# Patient Record
Sex: Female | Born: 1980 | Race: White | Hispanic: No | Marital: Married | Smoking: Current some day smoker
Health system: Southern US, Community
[De-identification: ages and names within clinical notes are randomized; demographics above are authoritative.]

## PROBLEM LIST (undated history)

## (undated) DIAGNOSIS — N879 Dysplasia of cervix uteri, unspecified: Secondary | ICD-10-CM

## (undated) DIAGNOSIS — F419 Anxiety disorder, unspecified: Secondary | ICD-10-CM

## (undated) DIAGNOSIS — R112 Nausea with vomiting, unspecified: Secondary | ICD-10-CM

## (undated) DIAGNOSIS — N3010 Interstitial cystitis (chronic) without hematuria: Secondary | ICD-10-CM

## (undated) DIAGNOSIS — Z9889 Other specified postprocedural states: Secondary | ICD-10-CM

## (undated) HISTORY — PX: KNEE ARTHROSCOPY WITH LATERAL RELEASE: SHX5649

## (undated) HISTORY — DX: Dysplasia of cervix uteri, unspecified: N87.9

## (undated) HISTORY — PX: SYNOVECTOMY: SHX5180

## (undated) HISTORY — DX: Interstitial cystitis (chronic) without hematuria: N30.10

## (undated) HISTORY — PX: CYSTOSCOPY WITH HYDRODISTENSION AND BIOPSY: SHX5127

## (undated) HISTORY — PX: PLACEMENT OF BREAST IMPLANTS: SHX6334

## (undated) HISTORY — PX: CERVIX LESION DESTRUCTION: SHX591

---
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
## 1999-10-23 ENCOUNTER — Encounter: Payer: Self-pay | Admitting: *Deleted

## 1999-10-23 ENCOUNTER — Inpatient Hospital Stay (HOSPITAL_COMMUNITY): Admission: AD | Admit: 1999-10-23 | Discharge: 1999-10-23 | Payer: Self-pay | Admitting: Obstetrics and Gynecology

## 1999-10-24 ENCOUNTER — Encounter: Payer: Self-pay | Admitting: Obstetrics

## 1999-10-30 ENCOUNTER — Encounter: Admission: RE | Admit: 1999-10-30 | Discharge: 1999-10-30 | Payer: Self-pay | Admitting: Family Medicine

## 1999-11-13 ENCOUNTER — Encounter: Admission: RE | Admit: 1999-11-13 | Discharge: 1999-11-13 | Payer: Self-pay | Admitting: Family Medicine

## 1999-11-19 ENCOUNTER — Encounter: Admission: RE | Admit: 1999-11-19 | Discharge: 1999-11-19 | Payer: Self-pay | Admitting: Family Medicine

## 1999-12-26 ENCOUNTER — Encounter: Admission: RE | Admit: 1999-12-26 | Discharge: 1999-12-26 | Payer: Self-pay | Admitting: Family Medicine

## 1999-12-27 ENCOUNTER — Encounter: Admission: RE | Admit: 1999-12-27 | Discharge: 1999-12-27 | Payer: Self-pay | Admitting: Family Medicine

## 1999-12-31 ENCOUNTER — Encounter: Admission: RE | Admit: 1999-12-31 | Discharge: 1999-12-31 | Payer: Self-pay | Admitting: Family Medicine

## 2000-01-03 ENCOUNTER — Ambulatory Visit (HOSPITAL_COMMUNITY): Admission: RE | Admit: 2000-01-03 | Discharge: 2000-01-03 | Payer: Self-pay | Admitting: Obstetrics

## 2000-01-11 ENCOUNTER — Encounter: Admission: RE | Admit: 2000-01-11 | Discharge: 2000-01-11 | Payer: Self-pay | Admitting: Family Medicine

## 2000-01-16 ENCOUNTER — Encounter: Admission: RE | Admit: 2000-01-16 | Discharge: 2000-01-16 | Payer: Self-pay | Admitting: Family Medicine

## 2000-01-28 ENCOUNTER — Encounter: Admission: RE | Admit: 2000-01-28 | Discharge: 2000-01-28 | Payer: Self-pay | Admitting: Family Medicine

## 2000-02-04 ENCOUNTER — Encounter: Admission: RE | Admit: 2000-02-04 | Discharge: 2000-02-04 | Payer: Self-pay | Admitting: Family Medicine

## 2000-02-11 ENCOUNTER — Inpatient Hospital Stay (HOSPITAL_COMMUNITY): Admission: AD | Admit: 2000-02-11 | Discharge: 2000-02-11 | Payer: Self-pay | Admitting: Obstetrics

## 2000-02-14 ENCOUNTER — Encounter: Admission: RE | Admit: 2000-02-14 | Discharge: 2000-02-14 | Payer: Self-pay | Admitting: Family Medicine

## 2000-02-14 ENCOUNTER — Encounter (HOSPITAL_COMMUNITY): Admission: RE | Admit: 2000-02-14 | Discharge: 2000-02-18 | Payer: Self-pay | Admitting: *Deleted

## 2000-02-14 ENCOUNTER — Ambulatory Visit (HOSPITAL_COMMUNITY): Admission: RE | Admit: 2000-02-14 | Discharge: 2000-02-14 | Payer: Self-pay | Admitting: Sports Medicine

## 2000-02-17 ENCOUNTER — Inpatient Hospital Stay (HOSPITAL_COMMUNITY): Admission: AD | Admit: 2000-02-17 | Discharge: 2000-02-20 | Payer: Self-pay | Admitting: Obstetrics

## 2000-04-14 ENCOUNTER — Encounter: Admission: RE | Admit: 2000-04-14 | Discharge: 2000-04-14 | Payer: Self-pay | Admitting: Family Medicine

## 2000-05-22 ENCOUNTER — Encounter: Admission: RE | Admit: 2000-05-22 | Discharge: 2000-05-22 | Payer: Self-pay | Admitting: Family Medicine

## 2001-06-09 ENCOUNTER — Emergency Department (HOSPITAL_COMMUNITY): Admission: EM | Admit: 2001-06-09 | Discharge: 2001-06-09 | Payer: Self-pay

## 2001-12-31 ENCOUNTER — Inpatient Hospital Stay (HOSPITAL_COMMUNITY): Admission: AD | Admit: 2001-12-31 | Discharge: 2001-12-31 | Payer: Self-pay | Admitting: *Deleted

## 2002-01-03 ENCOUNTER — Encounter: Payer: Self-pay | Admitting: Obstetrics and Gynecology

## 2002-01-03 ENCOUNTER — Inpatient Hospital Stay (HOSPITAL_COMMUNITY): Admission: AD | Admit: 2002-01-03 | Discharge: 2002-01-03 | Payer: Self-pay | Admitting: Obstetrics and Gynecology

## 2002-09-23 ENCOUNTER — Inpatient Hospital Stay (HOSPITAL_COMMUNITY): Admission: AD | Admit: 2002-09-23 | Discharge: 2002-09-23 | Payer: Self-pay | Admitting: *Deleted

## 2005-11-11 ENCOUNTER — Inpatient Hospital Stay (HOSPITAL_COMMUNITY): Admission: AD | Admit: 2005-11-11 | Discharge: 2005-11-12 | Payer: Self-pay | Admitting: Obstetrics & Gynecology

## 2007-05-05 ENCOUNTER — Ambulatory Visit (HOSPITAL_BASED_OUTPATIENT_CLINIC_OR_DEPARTMENT_OTHER): Admission: RE | Admit: 2007-05-05 | Discharge: 2007-05-05 | Payer: Self-pay | Admitting: Urology

## 2007-05-05 HISTORY — PX: CYSTOSCOPY WITH HYDRODISTENSION AND BIOPSY: SHX5127

## 2007-05-07 ENCOUNTER — Emergency Department (HOSPITAL_COMMUNITY): Admission: EM | Admit: 2007-05-07 | Discharge: 2007-05-07 | Payer: Self-pay | Admitting: Emergency Medicine

## 2007-06-30 ENCOUNTER — Encounter: Admission: RE | Admit: 2007-06-30 | Discharge: 2007-07-22 | Payer: Self-pay | Admitting: Urology

## 2007-10-18 ENCOUNTER — Emergency Department (HOSPITAL_COMMUNITY): Admission: EM | Admit: 2007-10-18 | Discharge: 2007-10-18 | Payer: Self-pay | Admitting: Emergency Medicine

## 2007-11-30 ENCOUNTER — Emergency Department (HOSPITAL_COMMUNITY): Admission: EM | Admit: 2007-11-30 | Discharge: 2007-11-30 | Payer: Self-pay | Admitting: Emergency Medicine

## 2009-03-24 ENCOUNTER — Ambulatory Visit (HOSPITAL_COMMUNITY): Admission: RE | Admit: 2009-03-24 | Discharge: 2009-03-24 | Payer: Self-pay | Admitting: Obstetrics & Gynecology

## 2009-10-19 ENCOUNTER — Encounter: Admission: RE | Admit: 2009-10-19 | Discharge: 2009-10-19 | Payer: Self-pay | Admitting: Internal Medicine

## 2010-03-31 ENCOUNTER — Emergency Department (HOSPITAL_COMMUNITY): Admission: EM | Admit: 2010-03-31 | Discharge: 2010-03-31 | Payer: Self-pay | Admitting: Emergency Medicine

## 2010-10-04 LAB — POCT I-STAT, CHEM 8
BUN: 6 mg/dL (ref 6–23)
Calcium, Ion: 1.04 mmol/L — ABNORMAL LOW (ref 1.12–1.32)
Chloride: 111 meq/L (ref 96–112)
Creatinine, Ser: 0.7 mg/dL (ref 0.4–1.2)
Glucose, Bld: 114 mg/dL — ABNORMAL HIGH (ref 70–99)
HCT: 49 % — ABNORMAL HIGH (ref 36.0–46.0)
Hemoglobin: 16.7 g/dL — ABNORMAL HIGH (ref 12.0–15.0)
Potassium: 5.5 meq/L — ABNORMAL HIGH (ref 3.5–5.1)
Sodium: 138 mEq/L (ref 135–145)
TCO2: 20 mmol/L (ref 0–100)

## 2010-10-04 LAB — CBC
HCT: 46.2 % — ABNORMAL HIGH (ref 36.0–46.0)
Hemoglobin: 16.1 g/dL — ABNORMAL HIGH (ref 12.0–15.0)
MCH: 31.7 pg (ref 26.0–34.0)
MCHC: 34.8 g/dL (ref 30.0–36.0)
MCV: 90.9 fL (ref 78.0–100.0)
Platelets: 284 10*3/uL (ref 150–400)
RBC: 5.08 MIL/uL (ref 3.87–5.11)
RDW: 12.7 % (ref 11.5–15.5)
WBC: 15.5 K/uL — ABNORMAL HIGH (ref 4.0–10.5)

## 2010-10-04 LAB — DIFFERENTIAL
Basophils Absolute: 0.1 10*3/uL (ref 0.0–0.1)
Basophils Relative: 0 % (ref 0–1)
Eosinophils Absolute: 0.7 10*3/uL (ref 0.0–0.7)
Eosinophils Relative: 5 % (ref 0–5)
Lymphocytes Relative: 13 % (ref 12–46)
Lymphs Abs: 2 K/uL (ref 0.7–4.0)
Monocytes Absolute: 1.2 K/uL — ABNORMAL HIGH (ref 0.1–1.0)
Monocytes Relative: 8 % (ref 3–12)
Neutro Abs: 11.6 10*3/uL — ABNORMAL HIGH (ref 1.7–7.7)
Neutrophils Relative %: 75 % (ref 43–77)

## 2010-10-04 LAB — D-DIMER, QUANTITATIVE: D-Dimer, Quant: 0.28 ug{FEU}/mL (ref 0.00–0.48)

## 2010-10-04 LAB — POCT PREGNANCY, URINE: Preg Test, Ur: NEGATIVE

## 2010-10-26 LAB — CBC
Hemoglobin: 14.5 g/dL (ref 12.0–15.0)
RBC: 4.35 MIL/uL (ref 3.87–5.11)
WBC: 8.1 10*3/uL (ref 4.0–10.5)

## 2010-10-26 LAB — PREGNANCY, URINE: Preg Test, Ur: NEGATIVE

## 2010-12-04 NOTE — Op Note (Signed)
NAMECHARIE, Diane Gamble               ACCOUNT NO.:  [id number]   MEDICAL RECORD NO.:  [id number]          PATIENT TYPE:  AMB   LOCATION:  [facility]                         FACILITY:  [facility]   PHYSICIAN:  Martina Sinner, MD DATE OF BIRTH:  02/20/1981   DATE OF PROCEDURE:  05/05/2007  DATE OF DISCHARGE:                               OPERATIVE REPORT   PREOPERATIVE DIAGNOSIS:  Pelvic pain.   POSTOPERATIVE DIAGNOSIS:  Pelvic pain, interstitial cystitis.   SURGERY:  Cystoscopy, bladder hydrodistention and bladder instillation  therapy.   Diane Gamble has right and left lower quadrant pain.  She has  dyssynergia on urodynamics.  She does have dyspareunia.   She was initially cystoscoped with a 21 [place] scope.  Bladder mucosa  and trigone were normal.  There was no stent,  foreign body, or  carcinoma.  She was hydrodistended to 800 ml.  On re-examination of the  bladder after emptying it, she had diffuse glomerulations throughout her  entire bladder.  I was almost surprised she did not have an ulcer, based  on the degree of bleeding.  I irrigated her bladder for a few moments  until it was pink.  I instilled 15 cc of 0.5% Marcaine plus 400 mg of  Pyridium.   I will proceed to treat [name] for interstitial cystitis.           ______________________________  Martina Sinner, MD  Electronically Signed     SAM/MEDQ  D:  05/05/2007  T:  05/05/2007  Job:  [id number]

## 2011-04-15 LAB — URINE MICROSCOPIC-ADD ON

## 2011-04-15 LAB — DIFFERENTIAL
Basophils Absolute: 0.1
Eosinophils Absolute: 0.1
Lymphocytes Relative: 11 — ABNORMAL LOW
Neutro Abs: 10.5 — ABNORMAL HIGH
Neutrophils Relative %: 79 — ABNORMAL HIGH

## 2011-04-15 LAB — BASIC METABOLIC PANEL
BUN: 8
CO2: 22
Chloride: 100
Creatinine, Ser: 0.82
GFR calc Af Amer: >60
Glucose, Bld: 124 — ABNORMAL HIGH

## 2011-04-15 LAB — URINALYSIS, ROUTINE W REFLEX MICROSCOPIC
Ketones, ur: 15 — AB
Leukocytes, UA: NEGATIVE
Nitrite: NEGATIVE
Protein, ur: 30 — AB
Urobilinogen, UA: 1

## 2011-04-15 LAB — CBC
HCT: 43.5
Hemoglobin: 14.8
MCHC: 34
Platelets: 210
WBC: 13.3 — ABNORMAL HIGH

## 2011-04-17 LAB — POCT URINALYSIS DIP (DEVICE)
Hgb urine dipstick: NEGATIVE
Nitrite: NEGATIVE
Protein, ur: 30 — AB
Urobilinogen, UA: 1

## 2011-05-01 LAB — URINALYSIS, ROUTINE W REFLEX MICROSCOPIC
Glucose, UA: NEGATIVE
Nitrite: NEGATIVE
Protein, ur: NEGATIVE
pH: 7

## 2011-05-01 LAB — WET PREP, GENITAL
Clue Cells Wet Prep HPF POC: NONE SEEN
Trich, Wet Prep: NONE SEEN
WBC, Wet Prep HPF POC: NONE SEEN
Yeast Wet Prep HPF POC: NONE SEEN

## 2011-05-01 LAB — CBC
MCHC: 34.8
Platelets: 279
RDW: 13

## 2011-05-01 LAB — PREGNANCY, URINE: Preg Test, Ur: NEGATIVE

## 2011-05-01 LAB — COMPREHENSIVE METABOLIC PANEL
ALT: 11
AST: 14
BUN: 8
Calcium: 8.7
GFR calc Af Amer: >60
GFR calc non Af Amer: >60
Glucose, Bld: 94
Sodium: 138

## 2011-05-01 LAB — URINE MICROSCOPIC-ADD ON

## 2011-05-01 LAB — DIFFERENTIAL
Basophils Absolute: 0.1
Basophils Relative: 1
Eosinophils Relative: 6 — ABNORMAL HIGH
Monocytes Absolute: 0.4
Neutro Abs: 4.2
Neutrophils Relative %: 53

## 2011-05-01 LAB — GC/CHLAMYDIA PROBE AMP, GENITAL
Chlamydia, DNA Probe: NEGATIVE
GC Probe Amp, Genital: NEGATIVE

## 2011-05-01 LAB — LIPASE, BLOOD: Lipase: 14

## 2011-05-02 LAB — POCT HEMOGLOBIN-HEMACUE: Hemoglobin: 13.8

## 2011-05-02 LAB — POCT PREGNANCY, URINE
Operator id: 114531
Preg Test, Ur: NEGATIVE

## 2014-08-15 ENCOUNTER — Encounter (HOSPITAL_COMMUNITY): Payer: Self-pay | Admitting: *Deleted

## 2014-08-15 ENCOUNTER — Encounter (HOSPITAL_COMMUNITY): Payer: Self-pay | Admitting: Emergency Medicine

## 2014-08-15 ENCOUNTER — Emergency Department (HOSPITAL_COMMUNITY): Payer: Medicaid Other

## 2014-08-15 ENCOUNTER — Emergency Department (HOSPITAL_COMMUNITY)
Admission: EM | Admit: 2014-08-15 | Discharge: 2014-08-15 | Disposition: A | Discharge status: Home / Self Care | Payer: Medicaid Other | Attending: Emergency Medicine | Admitting: Emergency Medicine

## 2014-08-15 ENCOUNTER — Emergency Department (HOSPITAL_COMMUNITY)
Admission: EM | Admit: 2014-08-15 | Discharge: 2014-08-15 | Disposition: A | Discharge status: Home / Self Care | Payer: Medicaid Other | Source: Home / Self Care | Attending: Emergency Medicine | Admitting: Emergency Medicine

## 2014-08-15 DIAGNOSIS — S90851A Superficial foreign body, right foot, initial encounter: Secondary | ICD-10-CM | POA: Diagnosis not present

## 2014-08-15 DIAGNOSIS — Y9289 Other specified places as the place of occurrence of the external cause: Secondary | ICD-10-CM | POA: Insufficient documentation

## 2014-08-15 DIAGNOSIS — Y998 Other external cause status: Secondary | ICD-10-CM | POA: Diagnosis not present

## 2014-08-15 DIAGNOSIS — S91341D Puncture wound with foreign body, right foot, subsequent encounter: Secondary | ICD-10-CM | POA: Insufficient documentation

## 2014-08-15 DIAGNOSIS — Z720 Tobacco use: Secondary | ICD-10-CM | POA: Diagnosis not present

## 2014-08-15 DIAGNOSIS — Z23 Encounter for immunization: Secondary | ICD-10-CM | POA: Insufficient documentation

## 2014-08-15 DIAGNOSIS — S90851D Superficial foreign body, right foot, subsequent encounter: Secondary | ICD-10-CM

## 2014-08-15 DIAGNOSIS — W458XXA Other foreign body or object entering through skin, initial encounter: Secondary | ICD-10-CM | POA: Diagnosis not present

## 2014-08-15 DIAGNOSIS — M795 Residual foreign body in soft tissue: Secondary | ICD-10-CM

## 2014-08-15 DIAGNOSIS — Y9389 Activity, other specified: Secondary | ICD-10-CM | POA: Diagnosis not present

## 2014-08-15 DIAGNOSIS — Y288XXD Contact with other sharp object, undetermined intent, subsequent encounter: Secondary | ICD-10-CM | POA: Insufficient documentation

## 2014-08-15 MED ORDER — LIDOCAINE HCL (PF) 1 % IJ SOLN
5.0000 mL | Freq: Once | INTRAMUSCULAR | Status: AC
  Administered 2014-08-15: 5 mL
  Filled 2014-08-15: qty 5

## 2014-08-15 MED ORDER — HYDROCODONE-ACETAMINOPHEN 5-325 MG PO TABS
2.0000 | ORAL_TABLET | Freq: Once | ORAL | Status: AC
  Administered 2014-08-15: 2 via ORAL
  Filled 2014-08-15: qty 2

## 2014-08-15 MED ORDER — CEPHALEXIN 500 MG PO CAPS: 500.0000 mg | ORAL_CAPSULE | Freq: Two times a day (BID) | ORAL | Status: DC

## 2014-08-15 MED ORDER — SODIUM BICARBONATE 4 % IV SOLN
5.0000 mL | Freq: Once | INTRAVENOUS | Status: AC
  Administered 2014-08-15: 5 mL via SUBCUTANEOUS
  Filled 2014-08-15: qty 5

## 2014-08-15 MED ORDER — TETANUS-DIPHTH-ACELL PERTUSSIS 5-2.5-18.5 LF-MCG/0.5 IM SUSP
0.5000 mL | Freq: Once | INTRAMUSCULAR | Status: AC
  Administered 2014-08-15: 0.5 mL via INTRAMUSCULAR
  Filled 2014-08-15: qty 0.5

## 2014-08-15 MED ORDER — ACETAMINOPHEN 325 MG PO TABS
650.0000 mg | ORAL_TABLET | Freq: Once | ORAL | Status: AC
  Administered 2014-08-15: 650 mg via ORAL
  Filled 2014-08-15: qty 2

## 2014-08-15 MED ORDER — KETOROLAC TROMETHAMINE 60 MG/2ML IM SOLN
60.0000 mg | Freq: Once | INTRAMUSCULAR | Status: AC
  Administered 2014-08-15: 60 mg via INTRAMUSCULAR
  Filled 2014-08-15: qty 2

## 2014-08-15 MED ORDER — HYDROCODONE-ACETAMINOPHEN 5-325 MG PO TABS: 1.0000 | ORAL_TABLET | Freq: Four times a day (QID) | ORAL | Status: DC | PRN

## 2014-08-15 NOTE — ED Provider Notes (Signed)
CSN: [id number]     Arrival date & time 08/15/14  1551 History  This chart was scribed for non-physician practitioner, Santiago GladHeather Jesenia Spera, PA-C,  working with Rolan BuccoMelanie Belfi, MD, by Lionel DecemberHatice Demirci, ED Scribe. This patient was seen in room TR06C/TR06C and the patient's care was started at 4:34 PM.   First MD Initiated Contact with Patient 08/15/14 1616     Chief Complaint  Patient presents with  . Foot Pain     (Consider location/radiation/quality/duration/timing/severity/associated sxs/prior Treatment) HPI  HPI Comments: Diane Gamble is a 34 y.o. female who presents to the Emergency Department complaining stepping on a sewing needle last night (bottom of right food).  [facility] when they updated her tetanus and did an x ray of her heel which showed the needle. They attempted to remove the needle but removal was unsuccessful so they gave her prescription for Keflex and gave her referral to Orthopedic Surgery.  Patient notes that since her first visit this morning, she has been using tylenol and Ibuprofen.  She notes that she cant see the surgeon till Wednesday. Pain worse with bearing weight and walking.  Patient is wondering if she can manage her pain till Wednesday or if she could see another Careers advisersurgeon.  Notes her insurance will not let her see any other surgeon other than the one that is listed on her paperwork.  She denies numbness or tingling.         History reviewed. No pertinent past medical history. History reviewed. No pertinent past surgical history. History reviewed. No pertinent family history. History  Substance Use Topics  . Smoking status: Current Every Day Smoker -- 0.50 packs/day    Types: Cigarettes  . Smokeless tobacco: Not on file  . Alcohol Use: No   OB History    No data available     Review of Systems  Constitutional: Negative for fever and chills.  Skin: Positive for wound.      Allergies  Review of patient's  allergies indicates no known allergies.  Home Medications   Prior to Admission medications   Medication Sig Start Date End Date Taking? Authorizing Provider  cephALEXin (KEFLEX) 500 MG capsule Take 1 capsule (500 mg total) by mouth 2 (two) times daily. 08/15/14   Juliet RudeNathan R. Pickering, MD   BP 136/76 mmHg  Pulse 83  Temp(Src) 98.3 F (36.8 C) (Oral)  Resp 18  SpO2 98%  LMP 07/08/2014 Physical Exam  Constitutional: She appears well-developed and well-nourished.  HENT:  Head: Normocephalic and atraumatic.  Mouth/Throat: Oropharynx is clear and moist.  Neck: Normal range of motion. Neck supple.  Cardiovascular: Normal rate, regular rhythm and normal heart sounds.   Pulses:      Dorsalis pedis pulses are 2+ on the right side, and 2+ on the left side.  Pulmonary/Chest: Effort normal and breath sounds normal.  Musculoskeletal:   Right foot: She has a puncture wound to the right medial part of the heel No active bleeding or purulent draining, no surrounding erythema, edema or warmth  Neurological: She is alert.  Distal sensation of the right foot intact  Skin: Skin is warm and dry.  Psychiatric: She has a normal mood and affect.  Nursing note and vitals reviewed.   ED Course  Procedures (including critical care time)  DIAGNOSTIC STUDIES: Oxygen Saturation is 98% on RA, normal by my interpretation.    COORDINATION OF CARE: 4:38 PM Discussed treatment plan with patient at beside, the patient agrees  with the plan and has no further questions at this time.  Labs Review Labs Reviewed - No data to display  Imaging Review Dg Os Calcis Right  08/15/2014   CLINICAL DATA:  Stepped on sewing needle, with puncture wound at the plantar surface of the right medial heel. Needle broke off in heel. Initial encounter.  EXAM: RIGHT OS CALCIS - 2+ VIEW  COMPARISON:  None.  FINDINGS: There is a 1.8 cm high-density needle within the soft tissues of the medial heel, 4 mm deep to the skin surface and  directed superiorly and posteriorly.  There is no evidence of fracture or dislocation. Visualized joint spaces are preserved.  IMPRESSION: 1.8 cm high-density needle noted within the soft tissues of the medial aspect of the heel, 4 mm deep to the skin surface and directed superiorly and posteriorly.   Electronically Signed   By: Roanna Raider M.D.   On: 08/15/2014 01:40     EKG Interpretation None      MDM   Final diagnoses:  None   Patient with a puncture wound to the bottom of the foot.  Previous provider attempted to remove the needle this morning without success.  No signs of infection at this time.  Patient given referral to Orthopedic Surgery and discharged with Rx for pain medication.  Return precautions given.     Santiago Glad, PA-C 08/16/14 1610  Rolan Bucco, MD 08/22/14 [phone]

## 2014-08-15 NOTE — Discharge Instructions (Signed)
Sliver Removal °You have had a sliver (splinter) removed. This has caused a wound that extends through some or all layers of the skin and possibly into the subcutaneous tissue. This is the tissue just beneath the skin. Because these wounds can not be cleaned well, it is necessary to watch closely for infection. °AFTER THE PROCEDURE  °If a cut (incision) was necessary to remove this, it may have been repaired for you by your caregiver either with suturing, stapling, or adhesive strips. These keep together the skin edges and allow better and faster healing. °HOME CARE INSTRUCTIONS  °· A dressing may have been applied. This may be changed once per day or as instructed. If the dressing sticks, it may be soaked off with a gauze pad or clean cloth that has been dampened with soapy water or hydrogen peroxide. °· It is difficult to remove all slivers or foreign bodies as they may break or splinter into smaller pieces. Be aware that your body will work to remove the foreign substance. That is, the foreign body may work itself out of the wound. That is normal. °· Watch for signs of infection and notify your caregiver if you suspect a sliver or foreign body remains in the wound. °· You may have received a recommendation to follow up with your physician or a specialist. It is very important to call for or keep follow-up appointments in order to avoid infection or other complications. °· Only take over-the-counter or prescription medicines for pain, discomfort, or fever as directed by your caregiver. °· If antibiotics were prescribed, be sure to finish all of the medicine. °If you did not receive a tetanus shot today because you did not recall when your last one was given, check with your caregiver in the next day or two during follow up to determine if one is needed. °SEEK MEDICAL CARE IF:  °· The area around the wound has new or worsening redness or tenderness. °· Pus is coming from the wound °· There is a foul smell from the  wound or dressing °· The edges of a wound that had been repaired break open °SEEK IMMEDIATE MEDICAL CARE IF:  °· Red streaks are coming from the wound °· An unexplained oral temperature above 102° F (38.9° C) develops. °Document Released: 07/05/2000 Document Revised: 09/30/2011 Document Reviewed: 02/22/2008 °ExitCare® Patient Information ©2015 [employer]. This information is not intended to replace advice given to you by your health care provider. Make sure you discuss any questions you have with your health care provider. ° °

## 2014-08-15 NOTE — ED Notes (Signed)
Pt reports being seen last night for having sewing needle in bottom of right foot, they were unable to remove it and she was referred to ortho md but unable to get appt till wed and having severe pain.

## 2014-08-15 NOTE — ED Notes (Signed)
Attempted to show patient how to use crutches.  Demonstrated use.  States these are useless you have to be a junky to get pain medicine around here.  Medicated prior to this with tylenol per her request.

## 2014-08-15 NOTE — ED Notes (Signed)
Stepped on a needed.  Broken off in foot.  Bottom of right foot.

## 2014-08-15 NOTE — ED Provider Notes (Addendum)
CSN: [id number]     Arrival date & time 08/15/14  0045 History  This chart was scribed for [employer]. Rubin Payor, MD by Annye Asa, ED Scribe. This patient was seen in room B17C/B17C and the patient's care was started at 1:04 AM.    Chief Complaint  Patient presents with  . Foreign Body    bottom of right foot   The history is provided by the patient. No language interpreter was used.    HPI Comments: Diane Gamble is an otherwise healthy 34 y.o. female who presents to the Emergency Department complaining of a sewing needle in the sole of her right foot. She explains that her son was recently handling a needle and it must have fallen to the floor, where patient stepped on it just PTA. She denies fall with the injury. She denies any other medical concerns at this time.   History reviewed. No pertinent past medical history. History reviewed. No pertinent past surgical history. No family history on file. History  Substance Use Topics  . Smoking status: Current Every Day Smoker -- 0.50 packs/day    Types: Cigarettes  . Smokeless tobacco: Not on file  . Alcohol Use: No   OB History    No data available     Review of Systems  Skin: Positive for wound.  All other systems reviewed and are negative.  Allergies  Review of patient's allergies indicates no known allergies.  Home Medications   Prior to Admission medications   Medication Sig Start Date End Date Taking? Authorizing Provider  cephALEXin (KEFLEX) 500 MG capsule Take 1 capsule (500 mg total) by mouth 2 (two) times daily. 08/15/14   Juliet Rude. Elchonon Maxson, MD  HYDROcodone-acetaminophen (NORCO/VICODIN) 5-325 MG per tablet Take 1-2 tablets by mouth every 6 (six) hours as needed. 08/15/14   Heather Laisure, PA-C   BP 104/65 mmHg  Pulse 60  Temp(Src) 98.7 F (37.1 C) (Oral)  Resp 16  Ht  (1.651 m)  Wt 130 lb (58.968 kg)  BMI 21.63 kg/m2  SpO2 99%  LMP 07/08/2014 Physical Exam  Constitutional: She is oriented to person,  place, and time. She appears well-developed and well-nourished.  Anxious appearing  HENT:  Head: Normocephalic and atraumatic.  Neck: No tracheal deviation present.  Cardiovascular: Normal rate.   Pulmonary/Chest: Effort normal.  Neurological: She is alert and oriented to person, place, and time.  Skin: Skin is warm and dry.  Puncture wound on the right foot medially near the heel at the posterior end of the arch; needle was not palpable; small amount of dried blood present.   Psychiatric: She has a normal mood and affect. Her behavior is normal.  Nursing note and vitals reviewed.   ED Course  FOREIGN BODY REMOVAL Date/Time: 08/15/2014 1:30 AM Performed by: Benjiman Core R. Authorized by: Billee Cashing Consent: Verbal consent obtained. Written consent not obtained. Risks and benefits: risks, benefits and alternatives were discussed Consent given by: patient Patient understanding: patient states understanding of the procedure being performed Test results: test results available and properly labeled Site marked: the operative site was marked Required items: required blood products, implants, devices, and special equipment available Patient identity confirmed: verbally with patient and arm band Time out: Immediately prior to procedure a "time out" was called to verify the correct patient, procedure, equipment, support staff and site/side marked as required. Body area: skin General location: lower extremity Location details: right foot Anesthesia: local infiltration Local anesthetic: lidocaine 1% without epinephrine Anesthetic  total: 1 ml Patient sedated: no Patient restrained: no Patient cooperative: yes Localization method: probed Removal mechanism: alligator forceps and hemostat Tendon involvement: none Objects recovered: 0 Post-procedure assessment: foreign body not removed Patient tolerance: Patient tolerated the procedure well with no immediate complications      DIAGNOSTIC STUDIES: Oxygen Saturation is 98% on RA, normal by my interpretation.    COORDINATION OF CARE: 1:05 AM Discussed treatment plan with pt at bedside and pt agreed to plan.  Labs Review Labs Reviewed - No data to display  Imaging Review No results found.   EKG Interpretation None      MDM   Final diagnoses:  Foreign body (FB) in soft tissue   patient with needle in right foot. X-ray shows somewhat superficial. Small incision done and attempt to remove needle, but I was not able to remove it. Will have follow-up with orthopedic surgery or general surgery. patient was given crutches antibiotics and some pain medicine.   I personally performed the services described in this documentation, which was scribed in my presence. The recorded information has been reviewed and is accurate.       Juliet RudeNathan R. Rubin PayorPickering, MD 08/18/14 [id number]  Juliet RudeNathan R. Rubin PayorPickering, MD 08/18/14 [phone]

## 2014-08-15 NOTE — Discharge Instructions (Signed)
Continue taking the Keflex antibiotic.  Take pain medication as needed.  Do not drive or operate heavy machinery for 4-6 hours after taking medication.

## 2016-07-23 NOTE — H&P (Signed)
[facility]  [address]   [address] [place], [place] [id number] [phone]  A Division of [facility]  RE: Diane Gamble, Diane Gamble  [id number]        DOB: 02/25/1981  07-17-16  Reason for visit:  Followup MRI results for continued left knee pain.    HPI:  This is a chronic non-traumatic problem that has been going on for years.  She had an injection about six years ago and did therapy 4-5 years ago.  She uses Mobic and Flexeril intermittently, all without significant relief.  She has primarily anterior and lateral knee pain.    EXAMINATION: Well appearing female in no acute distress.  She is accompanied by her husband.  Left knee with mild medial joint line tenderness.  She does have tightness about the patella, especially laterally with abnormal patellar tilt.    IMAGES: X-rays reviewed by me:   MRI of the left knee shows edema in the superolateral fat pad consistent with lateral femoral condyle friction syndrome.    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 This has not responded to conservative measures, including injection, therapy, anti-inflammatory medicine, anti-spasm medicine, rest, ice, activity modification.  Dr. Eulah PontMurphy discussed the details of going with the pre- and postoperative risks of lateral release, including, but not limited to residual knee pain.  We also discussed the possibility of adding a Shields brace today.  She elects moving forward with a lateral release.  She will follow up with us postoperatively.     Jewel Baizeimothy D.  Eulah PontMurphy, M.D.  Dictated by Avis EpleyH.C. Martensen III, PA-C Electronically verified by Jewel Baizeimothy D. Eulah PontMurphy, M.D. TDM(HCM):pmw D 07-19-16  T 07-23-16

## 2016-07-26 ENCOUNTER — Encounter (HOSPITAL_BASED_OUTPATIENT_CLINIC_OR_DEPARTMENT_OTHER): Payer: Self-pay | Admitting: *Deleted

## 2016-08-02 ENCOUNTER — Encounter (HOSPITAL_BASED_OUTPATIENT_CLINIC_OR_DEPARTMENT_OTHER)
Admission: RE | Disposition: A | Discharge status: Home / Self Care | Payer: Self-pay | Source: Ambulatory Visit | Attending: Orthopedic Surgery

## 2016-08-02 ENCOUNTER — Ambulatory Visit (HOSPITAL_BASED_OUTPATIENT_CLINIC_OR_DEPARTMENT_OTHER): Payer: Medicaid Other | Admitting: Certified Registered"

## 2016-08-02 ENCOUNTER — Encounter (HOSPITAL_BASED_OUTPATIENT_CLINIC_OR_DEPARTMENT_OTHER): Payer: Self-pay | Admitting: Certified Registered"

## 2016-08-02 ENCOUNTER — Ambulatory Visit (HOSPITAL_BASED_OUTPATIENT_CLINIC_OR_DEPARTMENT_OTHER)
Admission: RE | Admit: 2016-08-02 | Discharge: 2016-08-02 | Disposition: A | Discharge status: Home / Self Care | Payer: Medicaid Other | Source: Ambulatory Visit | Attending: Orthopedic Surgery | Admitting: Orthopedic Surgery

## 2016-08-02 DIAGNOSIS — M1712 Unilateral primary osteoarthritis, left knee: Secondary | ICD-10-CM | POA: Insufficient documentation

## 2016-08-02 DIAGNOSIS — M25862 Other specified joint disorders, left knee: Secondary | ICD-10-CM

## 2016-08-02 DIAGNOSIS — M6752 Plica syndrome, left knee: Secondary | ICD-10-CM | POA: Diagnosis not present

## 2016-08-02 DIAGNOSIS — F17210 Nicotine dependence, cigarettes, uncomplicated: Secondary | ICD-10-CM | POA: Insufficient documentation

## 2016-08-02 HISTORY — DX: Other specified postprocedural states: Z98.890

## 2016-08-02 HISTORY — DX: Anxiety disorder, unspecified: F41.9

## 2016-08-02 HISTORY — PX: SYNOVECTOMY: SHX5180

## 2016-08-02 HISTORY — PX: KNEE ARTHROSCOPY WITH LATERAL RELEASE: SHX5649

## 2016-08-02 HISTORY — DX: Other specified postprocedural states: R11.2

## 2016-08-02 SURGERY — ARTHROSCOPY, KNEE, WITH LATERAL RETINACULUM RELEASE
Anesthesia: General | Site: Knee | Laterality: Left

## 2016-08-02 MED ORDER — OXYCODONE HCL 5 MG/5ML PO SOLN: 5.0000 mg | Freq: Once | ORAL | Status: AC | PRN

## 2016-08-02 MED ORDER — DOCUSATE SODIUM 100 MG PO CAPS: 100.0000 mg | ORAL_CAPSULE | Freq: Two times a day (BID) | ORAL | 0 refills | Status: DC

## 2016-08-02 MED ORDER — CEFAZOLIN SODIUM-DEXTROSE 2-4 GM/100ML-% IV SOLN
2.0000 g | INTRAVENOUS | Status: AC
  Administered 2016-08-02: 2 g via INTRAVENOUS

## 2016-08-02 MED ORDER — HYDROMORPHONE HCL 1 MG/ML IJ SOLN
0.2500 mg | INTRAMUSCULAR | Status: DC | PRN
  Administered 2016-08-02 (×3): 0.5 mg via INTRAVENOUS

## 2016-08-02 MED ORDER — DEXAMETHASONE SODIUM PHOSPHATE 4 MG/ML IJ SOLN
INTRAMUSCULAR | Status: DC | PRN
  Administered 2016-08-02: 10 mg via INTRAVENOUS

## 2016-08-02 MED ORDER — PROPOFOL 500 MG/50ML IV EMUL
INTRAVENOUS | Status: AC
  Filled 2016-08-02: qty 50

## 2016-08-02 MED ORDER — FENTANYL CITRATE (PF) 100 MCG/2ML IJ SOLN
INTRAMUSCULAR | Status: AC
  Filled 2016-08-02: qty 2

## 2016-08-02 MED ORDER — BUPIVACAINE HCL (PF) 0.25 % IJ SOLN
INTRAMUSCULAR | Status: AC
  Filled 2016-08-02: qty 30

## 2016-08-02 MED ORDER — MEPERIDINE HCL 25 MG/ML IJ SOLN: 6.2500 mg | INTRAMUSCULAR | Status: DC | PRN

## 2016-08-02 MED ORDER — MIDAZOLAM HCL 2 MG/2ML IJ SOLN
1.0000 mg | INTRAMUSCULAR | Status: DC | PRN
  Administered 2016-08-02: 2 mg via INTRAVENOUS

## 2016-08-02 MED ORDER — ONDANSETRON HCL 4 MG/2ML IJ SOLN
INTRAMUSCULAR | Status: DC | PRN
  Administered 2016-08-02: 4 mg via INTRAVENOUS

## 2016-08-02 MED ORDER — LACTATED RINGERS IV SOLN
INTRAVENOUS | Status: DC
  Administered 2016-08-02: 10 mL/h via INTRAVENOUS
  Administered 2016-08-02 (×2): via INTRAVENOUS

## 2016-08-02 MED ORDER — ACETAMINOPHEN 500 MG PO TABS
1000.0000 mg | ORAL_TABLET | Freq: Once | ORAL | Status: AC
  Administered 2016-08-02: 1000 mg via ORAL

## 2016-08-02 MED ORDER — PROMETHAZINE HCL 25 MG/ML IJ SOLN
6.2500 mg | INTRAMUSCULAR | Status: AC | PRN
  Administered 2016-08-02 (×2): 6.25 mg via INTRAVENOUS

## 2016-08-02 MED ORDER — HYDROMORPHONE HCL 1 MG/ML IJ SOLN
INTRAMUSCULAR | Status: AC
  Filled 2016-08-02: qty 1

## 2016-08-02 MED ORDER — ASPIRIN EC 81 MG PO TBEC: 81.0000 mg | DELAYED_RELEASE_TABLET | Freq: Every day | ORAL | 0 refills | Status: DC

## 2016-08-02 MED ORDER — MIDAZOLAM HCL 2 MG/2ML IJ SOLN
INTRAMUSCULAR | Status: AC
  Filled 2016-08-02: qty 2

## 2016-08-02 MED ORDER — OXYCODONE-ACETAMINOPHEN 5-325 MG PO TABS: 1.0000 | ORAL_TABLET | ORAL | 0 refills | Status: DC | PRN

## 2016-08-02 MED ORDER — METOCLOPRAMIDE HCL 5 MG/ML IJ SOLN
INTRAMUSCULAR | Status: AC
  Filled 2016-08-02: qty 2

## 2016-08-02 MED ORDER — CEFAZOLIN SODIUM-DEXTROSE 2-4 GM/100ML-% IV SOLN
INTRAVENOUS | Status: AC
  Filled 2016-08-02: qty 100

## 2016-08-02 MED ORDER — LACTATED RINGERS IV SOLN: INTRAVENOUS | Status: DC

## 2016-08-02 MED ORDER — ONDANSETRON HCL 4 MG PO TABS: 4.0000 mg | ORAL_TABLET | Freq: Three times a day (TID) | ORAL | 0 refills | Status: DC | PRN

## 2016-08-02 MED ORDER — CHLORHEXIDINE GLUCONATE 4 % EX LIQD
60.0000 mL | Freq: Once | CUTANEOUS | Status: DC
Start: 2016-08-02 — End: 2016-08-02

## 2016-08-02 MED ORDER — ONDANSETRON HCL 4 MG/2ML IJ SOLN
INTRAMUSCULAR | Status: AC
  Filled 2016-08-02: qty 2

## 2016-08-02 MED ORDER — LIDOCAINE 2% (20 MG/ML) 5 ML SYRINGE
INTRAMUSCULAR | Status: DC | PRN
  Administered 2016-08-02: 100 mg via INTRAVENOUS

## 2016-08-02 MED ORDER — FENTANYL CITRATE (PF) 100 MCG/2ML IJ SOLN
50.0000 ug | INTRAMUSCULAR | Status: AC | PRN
  Administered 2016-08-02 (×4): 50 ug via INTRAVENOUS

## 2016-08-02 MED ORDER — BUPIVACAINE HCL (PF) 0.5 % IJ SOLN
INTRAMUSCULAR | Status: AC
  Filled 2016-08-02: qty 30

## 2016-08-02 MED ORDER — ACETAMINOPHEN 500 MG PO TABS
ORAL_TABLET | ORAL | Status: AC
  Filled 2016-08-02: qty 2

## 2016-08-02 MED ORDER — SCOPOLAMINE 1 MG/3DAYS TD PT72: 1.0000 | MEDICATED_PATCH | Freq: Once | TRANSDERMAL | Status: DC | PRN

## 2016-08-02 MED ORDER — PROPOFOL 10 MG/ML IV BOLUS
INTRAVENOUS | Status: DC | PRN
  Administered 2016-08-02: 200 mg via INTRAVENOUS

## 2016-08-02 MED ORDER — METOCLOPRAMIDE HCL 5 MG/ML IJ SOLN
INTRAMUSCULAR | Status: DC | PRN
  Administered 2016-08-02: 5 mg via INTRAVENOUS

## 2016-08-02 MED ORDER — LIDOCAINE 2% (20 MG/ML) 5 ML SYRINGE
INTRAMUSCULAR | Status: AC
  Filled 2016-08-02: qty 5

## 2016-08-02 MED ORDER — PROMETHAZINE HCL 25 MG/ML IJ SOLN
INTRAMUSCULAR | Status: AC
  Filled 2016-08-02: qty 1

## 2016-08-02 MED ORDER — DEXAMETHASONE SODIUM PHOSPHATE 10 MG/ML IJ SOLN
INTRAMUSCULAR | Status: AC
  Filled 2016-08-02: qty 1

## 2016-08-02 MED ORDER — METHOCARBAMOL 500 MG PO TABS: 500.0000 mg | ORAL_TABLET | Freq: Four times a day (QID) | ORAL | 0 refills | Status: DC | PRN

## 2016-08-02 MED ORDER — METHYLPREDNISOLONE ACETATE 80 MG/ML IJ SUSP
INTRAMUSCULAR | Status: AC
  Filled 2016-08-02: qty 1

## 2016-08-02 MED ORDER — BUPIVACAINE HCL (PF) 0.5 % IJ SOLN
INTRAMUSCULAR | Status: DC | PRN
  Administered 2016-08-02: 10 mL

## 2016-08-02 MED ORDER — OXYCODONE HCL 5 MG PO TABS
5.0000 mg | ORAL_TABLET | Freq: Once | ORAL | Status: AC | PRN
  Administered 2016-08-02: 5 mg via ORAL

## 2016-08-02 MED ORDER — METHYLPREDNISOLONE ACETATE 80 MG/ML IJ SUSP
INTRAMUSCULAR | Status: DC | PRN
  Administered 2016-08-02: 40 mg

## 2016-08-02 MED ORDER — SODIUM CHLORIDE 0.9 % IR SOLN
Status: DC | PRN
  Administered 2016-08-02: 3000 mL

## 2016-08-02 MED ORDER — OXYCODONE HCL 5 MG PO TABS
ORAL_TABLET | ORAL | Status: AC
  Filled 2016-08-02: qty 1

## 2016-08-02 SURGICAL SUPPLY — 38 items
BANDAGE ACE 6X5 VEL STRL LF (GAUZE/BANDAGES/DRESSINGS) ×4 IMPLANT
BLADE CUDA 5.5 (BLADE) IMPLANT
BLADE CUDA GRT WHITE 3.5 (BLADE) IMPLANT
BLADE CUTTER GATOR 3.5 (BLADE) ×4 IMPLANT
BLADE CUTTER MENIS 5.5 (BLADE) IMPLANT
CHLORAPREP W/TINT 26ML (MISCELLANEOUS) ×4 IMPLANT
CUFF TOURNIQUET SINGLE 34IN LL (TOURNIQUET CUFF) ×4 IMPLANT
CUTTER MENISCUS  4.2MM (BLADE)
CUTTER MENISCUS 4.2MM (BLADE) IMPLANT
DRAPE ARTHROSCOPY W/POUCH 90 (DRAPES) ×4 IMPLANT
DRAPE IMP U-DRAPE 54X76 (DRAPES) ×4 IMPLANT
DRAPE U-SHAPE 47X51 STRL (DRAPES) ×4 IMPLANT
DRSG EMULSION OIL 3X3 NADH (GAUZE/BANDAGES/DRESSINGS) ×4 IMPLANT
GAUZE SPONGE 4X4 12PLY STRL (GAUZE/BANDAGES/DRESSINGS) ×4 IMPLANT
GLOVE BIO SURGEON STRL SZ7.5 (GLOVE) ×8 IMPLANT
GLOVE BIOGEL PI IND STRL 6.5 (GLOVE) ×4 IMPLANT
GLOVE BIOGEL PI IND STRL 8 (GLOVE) ×4 IMPLANT
GLOVE BIOGEL PI IND STRL 8.5 (GLOVE) IMPLANT
GLOVE BIOGEL PI INDICATOR 6.5 (GLOVE) ×4
GLOVE BIOGEL PI INDICATOR 8 (GLOVE) ×4
GLOVE BIOGEL PI INDICATOR 8.5 (GLOVE)
GLOVE ECLIPSE 6.5 STRL STRAW (GLOVE) ×4 IMPLANT
GOWN STRL REUS W/ TWL LRG LVL3 (GOWN DISPOSABLE) ×4 IMPLANT
GOWN STRL REUS W/ TWL XL LVL3 (GOWN DISPOSABLE) ×2 IMPLANT
GOWN STRL REUS W/TWL LRG LVL3 (GOWN DISPOSABLE) ×4
GOWN STRL REUS W/TWL XL LVL3 (GOWN DISPOSABLE) ×2
KNEE WRAP E Z 3 GEL PACK (MISCELLANEOUS) ×4 IMPLANT
MANIFOLD NEPTUNE II (INSTRUMENTS) ×4 IMPLANT
PACK ARTHROSCOPY DSU (CUSTOM PROCEDURE TRAY) ×4 IMPLANT
PACK BASIN DAY SURGERY FS (CUSTOM PROCEDURE TRAY) ×4 IMPLANT
PROBE BIPOLAR ATHRO 135MM 90D (MISCELLANEOUS) IMPLANT
SET ARTHROSCOPY TUBING (MISCELLANEOUS) ×2
SET ARTHROSCOPY TUBING LN (MISCELLANEOUS) ×2 IMPLANT
SUT ETHILON 3 0 PS 1 (SUTURE) ×4 IMPLANT
TAPE CLOTH 3X10 TAN LF (GAUZE/BANDAGES/DRESSINGS) IMPLANT
TOWEL OR 17X24 6PK STRL BLUE (TOWEL DISPOSABLE) ×4 IMPLANT
TOWEL OR NON WOVEN STRL DISP B (DISPOSABLE) ×4 IMPLANT
WATER STERILE IRR 1000ML POUR (IV SOLUTION) ×4 IMPLANT

## 2016-08-02 NOTE — Op Note (Signed)
08/02/2016  12:50 PM  PATIENT:  Diane NeatAngela M Outland    PRE-OPERATIVE DIAGNOSIS:  PLICA SYNDROME LEFT KNEE, LEFT KNEE OA  POST-OPERATIVE DIAGNOSIS:  Same  PROCEDURE:  LEFT KNEE ARTHROSCOPY WITH LATERAL RELEASE, SYNOVECTOMY OF LEFT KNEE  SURGEON:  Aahil Fredin, Jewel BaizeIMOTHY D, MD  ASSISTANT: Aquilla HackerHenry Martensen, PA-C, he was present and scrubbed throughout the case, critical for completion in a timely fashion, and for retraction, instrumentation, and closure.   ANESTHESIA:   General  BLOOD LOSS: min  COMPLICATIONS: None   PREOPERATIVE INDICATIONS:  Diane Neatngela M Warren is a  36 y.o. female with a diagnosis of PLICA SYNDROME LEFT KNEE, LEFT KNEE OA who failed conservative measures and elected for surgical management.    The risks benefits and alternatives were discussed with the patient preoperatively including but not limited to the risks of infection, bleeding, nerve injury, cardiopulmonary complications, the need for revision surgery, among others, and the patient was willing to proceed.  OPERATIVE IMPLANTS: none  OPERATIVE FINDINGS:  Diagnostic Arthroscopy:  Abnormal patellar tilt Lateral grade 1 chondromalacia plica    OPERATIVE PROCEDURE:  Patient was identified in the preoperative holding area and site was marked by me female was transported to the operating theater and placed on the table in supine position taking care to pad all bony prominences. After a preincinduction time out anesthesia was induced.  female received ancef for preoperative antibiotics. The left lower extremity was prepped and draped in normal sterile fashion and a pre-incision timeout was performed.   A small stab incision was made in the anterolateral portal position. The arthroscope was introduced in the joint. A medial portal was then established under direct visualization just above the anterior horn of the medial meniscus. Diagnostic arthroscopy was then carried out with findings as described above.  I examined the  knee. Pathology noted above  I performed a chondroplasty lateral tibial plateau  I performed a lateral releasee  I debrided her Plica on both medial and lateral side.   The arthroscopic equipment was removed from the joint and the portals were closed with 3-0 nylon in an interrupted fashion. The knee was infiltrated with 40mg  Depomedrol.  Sterile dressings were then applied including Xeroform 4 x 4's ABDs an ACE bandage.  The patient was then allowed to awaken from general anesthesia, transferred to the stretcher and taken to the recovery room in stable condition.  POSTOPERATIVE PLAN: The patient will be discharged home today and will followup in one week for suture removal and wound check.  VTE prophylaxis: mobilization

## 2016-08-02 NOTE — Anesthesia Postprocedure Evaluation (Signed)
Anesthesia Post Note  Patient: Diane Gamble  Procedure(s) Performed: Procedure(s) (LRB): LEFT KNEE ARTHROSCOPY WITH LATERAL RELEASE (Left) SYNOVECTOMY OF LEFT KNEE (Left)  Patient location during evaluation: PACU Anesthesia Type: General Level of consciousness: awake and alert Pain management: pain level controlled Vital Signs Assessment: post-procedure vital signs reviewed and stable Respiratory status: spontaneous breathing, nonlabored ventilation, respiratory function stable and patient connected to nasal cannula oxygen Cardiovascular status: blood pressure returned to baseline and stable Postop Assessment: no signs of nausea or vomiting Anesthetic complications: no       Last Vitals:  Vitals:   08/02/16 1345 08/02/16 1501  BP: 109/71 113/75  Pulse: 61 67  Resp: 12   Temp: 36.7 C 36.4 C    Last Pain:  Vitals:   08/02/16 1501  TempSrc: Oral  PainSc: 4                  Cecile HearingStephen Edward Turk

## 2016-08-02 NOTE — Transfer of Care (Signed)
Immediate Anesthesia Transfer of Care Note  Patient: Diane Gamble  Procedure(s) Performed: Procedure(s): LEFT KNEE ARTHROSCOPY WITH LATERAL RELEASE (Left) SYNOVECTOMY OF LEFT KNEE (Left)  Patient Location: PACU  Anesthesia Type:General  Level of Consciousness: awake, alert , oriented and patient cooperative  Airway & Oxygen Therapy: Patient Spontanous Breathing and Patient connected to face mask oxygen  Post-op Assessment: Report given to RN, Post -op Vital signs reviewed and stable and Patient moving all extremities  Post vital signs: Reviewed and stable  Last Vitals:  Vitals:   08/02/16 1055  BP: 114/67  Pulse: 83  Resp: 18  Temp: 37 C    Last Pain:  Vitals:   08/02/16 1055  TempSrc: Oral  PainSc: 6       Patients Stated Pain Goal: 3 (08/02/16 1055)  Complications: No apparent anesthesia complications

## 2016-08-02 NOTE — Interval H&P Note (Signed)
History and Physical Interval Note:  08/02/2016 11:48 AM  Diane NeatAngela M Emry  has presented today for surgery, with the diagnosis of PLICA SYNDROME LEFT KNEE, LEFT KNEE OA  The various methods of treatment have been discussed with the patient and family. After consideration of risks, benefits and other options for treatment, the patient has consented to  Procedure(s): LEFT KNEE ARTHROSCOPY WITH LATERAL RELEASE (Left) SYNOVECTOMY OF LEFT KNEE (Left) as a surgical intervention .  The patient's history has been reviewed, patient examined, no change in status, stable for surgery.  I have reviewed the patient's chart and labs.  Questions were answered to the patient's satisfaction.     Christophor Eick D

## 2016-08-02 NOTE — Anesthesia Preprocedure Evaluation (Addendum)
Anesthesia Evaluation  Patient identified by MRN, date of birth, ID band Patient awake    Reviewed: Allergy & Precautions, NPO status , Patient's Chart, lab work & pertinent test results  History of Anesthesia Complications (+) PONV and history of anesthetic complications  Airway Mallampati: II  TM Distance: >3 FB Neck ROM: Full    Dental no notable dental hx.    Pulmonary Current Smoker,    Pulmonary exam normal breath sounds clear to auscultation       Cardiovascular negative cardio ROS Normal cardiovascular exam Rhythm:Regular Rate:Normal     Neuro/Psych negative neurological ROS  negative psych ROS   GI/Hepatic negative GI ROS, Neg liver ROS,   Endo/Other  negative endocrine ROS  Renal/GU negative Renal ROS     Musculoskeletal negative musculoskeletal ROS (+)   Abdominal   Peds  Hematology negative hematology ROS (+)   Anesthesia Other Findings   Reproductive/Obstetrics negative OB ROS                            Anesthesia Physical Anesthesia Plan  ASA: II  Anesthesia Plan: General   Post-op Pain Management:    Induction: Intravenous  Airway Management Planned: LMA  Additional Equipment:   Intra-op Plan:   Post-operative Plan: Extubation in OR  Informed Consent: I have reviewed the patients History and Physical, chart, labs and discussed the procedure including the risks, benefits and alternatives for the proposed anesthesia with the patient or authorized representative who has indicated his/her understanding and acceptance.   Dental advisory given  Plan Discussed with: CRNA  Anesthesia Plan Comments:        Anesthesia Quick Evaluation

## 2016-08-02 NOTE — Discharge Instructions (Signed)
Diet: As you were doing prior to hospitalization   Shower:  May shower but keep the wounds dry, use an occlusive plastic wrap, NO SOAKING IN TUB.  If the bandage gets wet, change with a clean dry gauze.    Dressing:  You may change your dressing 3 days after surgery and shower over incisions.  Replace with clean dry dressing and re-apply ace wrap.  Activity:  Increase activity slowly as tolerated, but follow the weight bearing instructions below.  The rules on driving is that you can not be taking narcotics while you drive, and you must feel in control of the vehicle.    Weight Bearing:  As tolerated.  To prevent constipation: you may use a stool softener such as -  Colace (over the counter) 100 mg by mouth twice a day  Drink plenty of fluids (prune juice may be helpful) and high fiber foods Miralax (over the counter) for constipation as needed.    Itching:  If you experience itching with your medications, try taking only a single pain pill, or even half a pain pill at a time.  You may take up to 10 pain pills per day, and you can also use benadryl over the counter for itching or also to help with sleep.   Precautions:  If you experience chest pain or shortness of breath - call 911 immediately for transfer to the hospital emergency department!!  If you develop a fever greater that 101 F, purulent drainage from wound, increased redness or drainage from wound, or calf pain -- Call the office at [phone]                                                Follow- Up Appointment:  Please call for an appointment to be seen in 2 weeks [place] - [phone]

## 2016-08-02 NOTE — Anesthesia Procedure Notes (Signed)
Procedure Name: LMA Insertion Date/Time: 08/02/2016 12:05 PM Performed by: Curly ShoresRAFT, Mitcheal Sweetin W Pre-anesthesia Checklist: Patient identified, Emergency Drugs available, Suction available and Patient being monitored Patient Re-evaluated:Patient Re-evaluated prior to inductionOxygen Delivery Method: Circle system utilized Preoxygenation: Pre-oxygenation with 100% oxygen Intubation Type: IV induction Ventilation: Mask ventilation without difficulty LMA: LMA inserted LMA Size: 3.0 Number of attempts: 1 Airway Equipment and Method: Bite block Placement Confirmation: positive ETCO2 and breath sounds checked- equal and bilateral Tube secured with: Tape Dental Injury: Teeth and Oropharynx as per pre-operative assessment

## 2016-08-06 ENCOUNTER — Encounter (HOSPITAL_BASED_OUTPATIENT_CLINIC_OR_DEPARTMENT_OTHER): Payer: Self-pay | Admitting: Orthopedic Surgery

## 2016-10-03 ENCOUNTER — Encounter: Payer: Self-pay | Admitting: Physical Therapy

## 2016-10-03 ENCOUNTER — Ambulatory Visit: Payer: Medicaid Other | Attending: Orthopedic Surgery | Admitting: Physical Therapy

## 2016-10-03 DIAGNOSIS — M25562 Pain in left knee: Secondary | ICD-10-CM | POA: Insufficient documentation

## 2016-10-03 DIAGNOSIS — M6281 Muscle weakness (generalized): Secondary | ICD-10-CM | POA: Diagnosis present

## 2016-10-03 DIAGNOSIS — M25662 Stiffness of left knee, not elsewhere classified: Secondary | ICD-10-CM | POA: Insufficient documentation

## 2016-10-03 DIAGNOSIS — R2689 Other abnormalities of gait and mobility: Secondary | ICD-10-CM

## 2016-10-03 NOTE — Therapy (Signed)
[facility] [address] [place], [place], [address] Phone: [phone]   Fax:  [phone]  Physical Therapy Evaluation  Patient Details  Name: Diane Gamble MRN: [id number] Date of Birth: Sep 13, 1980 Referring Provider: Margarita Rana D MD  Encounter Date: 10/03/2016      PT End of Session - 10/03/16 1207    Visit Number 1   Number of Visits 4   Date for PT Re-Evaluation 11/14/16   Authorization Type Medicaid   PT Start Time 1130   PT Stop Time 1220   PT Time Calculation (min) 50 min   Activity Tolerance Patient tolerated treatment well   Behavior During Therapy [facility] for tasks assessed/performed      Past Medical History:  Diagnosis Date  . Anxiety   . PONV (postoperative nausea and vomiting)     Past Surgical History:  Procedure Laterality Date  . CYSTOSCOPY WITH HYDRODISTENSION AND BIOPSY  05/05/2007  . KNEE ARTHROSCOPY WITH LATERAL RELEASE Left 08/02/2016   Procedure: LEFT KNEE ARTHROSCOPY WITH LATERAL RELEASE;  Surgeon: Sheral Apley, MD;  Location: [facility];  Service: Orthopedics;  Laterality: Left;  . SYNOVECTOMY Left 08/02/2016   Procedure: SYNOVECTOMY OF LEFT KNEE;  Surgeon: Sheral Apley, MD;  Location: [facility];  Service: Orthopedics;  Laterality: Left;    There were no vitals filed for this visit.       Subjective Assessment - 10/03/16 1146    Subjective I had pain all the time andcould not crawl into bed on my knees before surgery.  I had surgery on 08-02-16 for left knee with arthroscopy, and left lateral release and my pain is now 2/10,    Limitations House hold activities  squatting and go to gym   How long can you sit comfortably? unlimited  squatting difficult   How long can you stand comfortably? unlimited   How long can you walk comfortably? unlimited   Diagnostic tests x ray   Patient Stated Goals Get knee stronger and be able to bend/squat without pain  and go to gym. 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 Yes   Pain Score 2    Pain Location Knee   Pain Orientation Left   Pain Descriptors / Indicators Tightness;Stabbing   Pain Type Chronic pain   Pain Onset More than a month ago   Pain Frequency Intermittent   Aggravating Factors  bending over, squatting, going down steps   Pain Relieving Factors ice,  ibuprofen if needed            [facility] PT Assessment - 10/03/16 1131      Assessment   Medical Diagnosis Left knee pain post arthroscopy and left lateral release   Referring Provider Margarita Rana D MD   Onset Date/Surgical Date 08/02/16  left arthroscopy and lateral release   Hand Dominance Right   Next MD Visit 4 weeks out   Prior Therapy none     Precautions   Precautions None   Precaution Comments " don't over due anything"     Restrictions   Weight Bearing Restrictions No     Balance Screen   Has the patient fallen in the past 6 months No   Has the patient had a decrease in activity level because of a fear of falling?  No   Is the patient reluctant to leave their home because of a fear of falling?  No     Home [employer]ist information centre manager residence   Living Arrangements Children;Spouse/significant other   Type of Home House   Home Access Stairs to enter   Entrance Stairs-Number of Steps 19   Entrance Stairs-Rails Right   Home Layout Multi-level  3 stories   Alternate Level Stairs-Number of Steps 14     Prior Function   Level of Independence Independent   Vocation Part time employment   Furniture conservator/restorer of [employer]     Cognition   Overall Cognitive Status Within Functional Limits for tasks assessed     Observation/Other Assessments   Focus on Therapeutic Outcomes (FOTO)  FOTO Intake 49%, limitation 51% predicted 36%     Functional Tests   Functional tests Squat;Single Leg Squat     Squat   Comments  squats to 65 degrees  before tightness and pain wt shifting to right     Single Leg Squat   Comments on left with decreased motor control 5/5 times but able to reach ground.       Hopping   Comments vertical jump 5 inches      ROM / Strength   AROM / PROM / Strength AROM;PROM;Strength     AROM   Overall AROM  Deficits   Right Knee Extension 0   Right Knee Flexion 141   Left Knee Extension 0   Left Knee Flexion 135     PROM   Overall PROM  Deficits   Right Knee Extension -4   Right Knee Flexion 146   Left Knee Extension -4   Left Knee Flexion 141     Strength   Overall Strength Deficits   Overall Strength Comments abdominal 4/5   Right Hip Flexion 5/5   Right Hip Extension 4+/5   Right Hip ABduction 4/5   Left Hip Flexion 5/5   Left Hip Extension 4-/5   Left Hip ABduction 4-/5   Right Knee Flexion 5/5   Right Knee Extension 5/5   Left Knee Flexion 4+/5   Left Knee Extension 4/5   Right Ankle Dorsiflexion 5/5   Right Ankle Plantar Flexion 5/5   Left Ankle Dorsiflexion 5/5   Left Ankle Plantar Flexion 5/5     Flexibility   Hamstrings left 65,  right 77 AROM     Palpation   Patella mobility tight right lateral to medial patellar,  Pt with good mobility on left    Palpation comment tenderness on palpation of lateral knee to tibial tubercle on left     Ambulation/Gait   Gait Pattern Step-through pattern   Gait velocity 4.48 ft/sec    Stairs Yes   Stairs Assistance 7: Independent   Stair Management Technique Two rails   Number of Stairs 16   Height of Stairs 6   Gait Comments Pt with difficulty descending stairs circumducting hip to descend                             PT Short Term Goals - 10/03/16 1216      PT SHORT TERM GOAL #1   Title STG=LTG           PT Long Term Goals - 10/03/16 1217      PT LONG TERM GOAL #1   Title Pt will be independent with advanced HEP   Baseline Pt has no knowledge of HEP   Time 6   Period Weeks  Status New     PT  LONG TERM GOAL #2   Title Pt will be able to perform squat at knee flexion of at least 90 degrees in 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   Baseline pt only able to squat with 60 degrees knee flexion for prevention of pain   Time 6   Period Weeks   Status New     PT LONG TERM GOAL #3   Title Pt will be able to bend down and dust under TV stand without exacerbating pain in left knee   Baseline Pt unable to bend on one leg without decreased motor control / decreased stabiliity to touch floor   Time 6   Period Weeks   Status New     PT LONG TERM GOAL #4   Title Pt will be able to return to gym utilizing leg strength  for exericise with MMT in LE's at leadt 4 to 4+/5 in left knee and hips for increase motor control to perform injury free at gym   Baseline unable to utilize leg strengthening equipment   Time 6   Period Weeks   Status New     PT LONG TERM GOAL #5   Title "FOTO will improve from  51% limitation   to   36% limitation  indicating improved functional mobility.    Baseline 51 % liimitation at Eval   Time 6   Period Weeks   Status New               Plan - 10/03/16 1232    Clinical Impression Statement 36 yo female presents with low complexity eval post left knee arthroscopy and left lateral release (CPT [phone]) on 08-02-16 by Dr. Margarita Rana.  Pt had not had any PT prior to today and has been doing own exericises.  Mrs. Mcgirr has decreased AROM pain ,weakness of left lateral knee and unable to squat greater than 60 degrees witihout pain.  Pt also has slight antalgic gait, and pain weakness descending steps.  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  Pt has limited visits due to Medicaid and she does not want to self pay more than allotted visits.  Pt will benefit from 3 allowed visits in order to return to work and exercises and PLOF with no   exacerbating pain   Rehab Potential Excellent   PT Frequency 1x / week  3 additional visits   PT Duration 6 weeks   PT Treatment/Interventions Cryotherapy;Electrical Stimulation;Iontophoresis 4mg /ml Dexamethasone;Stair training;Ultrasound;Moist Heat;Therapeutic exercise;Neuromuscular re-education;Patient/family education;Passive range of motion;Manual techniques;Dry needling   PT Next Visit Plan Review ex,  hamstring stretch and sit to stand single limb progress as tolerated   PT Home Exercise Plan step ups lateral and forward , descending,  T band green ham curls and knee ext, quad set, sit to stand, wall slides   Consulted and Agree with Plan of Care Patient      Patient will benefit from skilled therapeutic intervention in order to improve the following deficits and impairments:  Decreased activity tolerance, Decreased mobility, Decreased range of motion, Decreased strength, Abnormal gait, Pain  Visit Diagnosis: Left knee pain, unspecified chronicity  Muscle weakness (generalized)  Other abnormalities of gait and mobility  Stiffness of left knee, not elsewhere classified     Problem List There are  no active problems to display for this patient.   Garen LahLawrie Thurley Francesconi, PT Certified Exercise Expert for the Aging Adult  10/03/16 12:42 PM Phone: [phone] Fax: [phone]  [facility] [address] [place], [place], [address] Phone: [phone]   Fax:  [phone]  Name: Diane Gamble MRN: [id number] Date of Birth: 11/23/1980

## 2016-10-03 NOTE — Patient Instructions (Addendum)
    Copyright  VHI. All rights reserved.  HIP: Flexion / KNEE: Extension, Straight Leg Raise     Quad Set   Slowly tighten muscles on thigh of straight leg while counting out loud to _5___.  Repeat __50__ times watching commercials on TV. Do _throughout day___ sessions per day.  http://[email or web address].exer.us/361   Copyright  VHI. All rights reserved.    Step Down: Anterior   From 4-6" step, reach one leg forward as far as possible, still able to return easily.  Touch heel to floor. Return to single leg balance. Repeat with other leg. Repeat __15_x 2_ times. Do 1-_2___ sessions per day. At start, hold __2-5__ pound dumbbells at: knee height. shoulder height. waist height. On return, bring weights: to waist. to shoulders. over head. After you can do this exercise with good motor control slowly prescribed.  Copyright  VHI. All rights reserved.  Step-Up: Lateral   Step up to side with right leg. Bring other foot up onto __6__ inch step. Return to floor position with left leg. Repeat __15 x 2__ times per session. Do __1-2__ sessions per day. Repeat in dimly lit room. Repeat with eyes closed.  Copyright  VHI. All rights reserved.  Forward   Facing step, place one leg on step, flexed at hip. Step up slowly, bringing hips in line with knee and shoulder. Bring other foot onto step. Reverse process to step back down. Repeat with other leg. Do _15 x 2___ repetitions, 1-2 x a day  http://bt.exer.us/154   Copyright  VHI. All rights reserved.  Knee Extension: Resisted (Sitting)   With band looped around right ankle and under other foot, straighten leg with ankle loop. Keep other leg bent to increase resistance. Repeat _15___ times per set. Do _1-2___ sets per session. Do __1-2__ sessions per day.  http://orth.[email or web address].us/690   CKnee Flexion: Resisted (Sitting)   Sit with band under left foot and looped around ankle of supported leg. Pull unsupported leg back. Repeat __15__ times per  set. Do __1-2__ sets per session. Do _1-2___ sessions per day.    Copyright  VHI. All rights reserved.  Functional Quadriceps: Sit to Stand   Sit on edge of chair, feet flat on floor. Stand upright, extending knees fully. Repeat _15___ times per set. Do __1__ sets per session. Do _1-2___ sessions per day. Dirty toilet ex  http://orth.exer.us/734   Copyright  VHI. All rights reserved.  Strengthening: Wall Slide   Leaning on wall, slowly lower buttocks until thighs are parallel to floor. Hold __10__ seconds. Tighten thigh muscles and return. Repeat __15__ times per set. Do __2__ sets per session. Do __1-2__ sessions per day.  http://orth.exer.us/630   Copyright  VHI. All rights reserved.  Garen LahLawrie Beardsley, PT Certified Exercise Expert for the Aging Adult  10/03/16 12:06 PM Phone: [phone] Fax: [phone]

## 2016-10-24 ENCOUNTER — Ambulatory Visit: Payer: Medicaid Other | Admitting: Physical Therapy

## 2016-11-05 ENCOUNTER — Encounter: Payer: Self-pay | Admitting: Physical Therapy

## 2016-11-05 ENCOUNTER — Ambulatory Visit: Payer: Medicaid Other | Attending: Orthopedic Surgery | Admitting: Physical Therapy

## 2016-11-05 DIAGNOSIS — M25562 Pain in left knee: Secondary | ICD-10-CM | POA: Diagnosis present

## 2016-11-05 DIAGNOSIS — R2689 Other abnormalities of gait and mobility: Secondary | ICD-10-CM

## 2016-11-05 DIAGNOSIS — M25662 Stiffness of left knee, not elsewhere classified: Secondary | ICD-10-CM

## 2016-11-05 DIAGNOSIS — M6281 Muscle weakness (generalized): Secondary | ICD-10-CM | POA: Diagnosis present

## 2016-11-05 NOTE — Patient Instructions (Addendum)
      http://orth.exer.us/630   Copyright  VHI. All rights reserved.    with     Garen Lah, P Certified Exercise Expert for the Aging Adult  11/05/16 3:16 PM Phone: [phone] Fax: [phone]

## 2016-11-05 NOTE — Therapy (Addendum)
[place], [place], [address] Phone: [phone]   Fax:  [phone]  Physical Therapy Treatment/Discharge Note  Patient Details  Name: Diane Gamble MRN: [id number] Date of Birth: 11-09-1980 Referring Provider: Edmonia Lynch D MD  Encounter Date: 11/05/2016      PT End of Session - 11/05/16 1457    Visit Number 2   Number of Visits 4   Date for PT Re-Evaluation 11/14/16   Authorization Type Medicaid   PT Start Time 0256   PT Stop Time 0340   PT Time Calculation (min) 44 min   Activity Tolerance Patient tolerated treatment well   Behavior During Therapy [facility] for tasks assessed/performed      Past Medical History:  Diagnosis Date  . Anxiety   . PONV (postoperative nausea and vomiting)     Past Surgical History:  Procedure Laterality Date  . CYSTOSCOPY WITH HYDRODISTENSION AND BIOPSY  05/05/2007  . KNEE ARTHROSCOPY WITH LATERAL RELEASE Left 08/02/2016   Procedure: LEFT KNEE ARTHROSCOPY WITH LATERAL RELEASE;  Surgeon: Renette Butters, MD;  Location: [place];  Service: Orthopedics;  Laterality: Left;  . SYNOVECTOMY Left 08/02/2016   Procedure: SYNOVECTOMY OF LEFT KNEE;  Surgeon: Renette Butters, MD;  Location: [place];  Service: Orthopedics;  Laterality: Left;    There were no vitals filed for this visit.      Subjective Assessment - 11/05/16 1506    Subjective I have no pain today. I was sore for 3 days after last time.      Limitations House hold activities   How long can you sit comfortably? unlimited  squatting difficult   How long can you stand comfortably? unlimited   How long can you walk comfortably? unlimited   Diagnostic tests x ray   Patient Stated Goals Get knee stronger and be able to bend/squat without pain and go to gym. 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 Yes   Pain Score 2    Pain Location  Knee   Pain Orientation Left   Pain Descriptors / Indicators Tightness   Pain Frequency Constant                         [facility] Adult PT Treatment/Exercise - 11/05/16 1458      Knee/Hip Exercises: Aerobic   Elliptical 8 minutes level 2 with UE /LE     Knee/Hip Exercises: Standing   Lateral Step Up Step Height: 8";20 reps   Lateral Step Up Limitations VC for slow and controlled movement   Forward Step Up Step Height: 8";20 reps   Forward Step Up Limitations VC for slow and controlled movement   Wall Squat 10 seconds;15 reps   SLS standing heel raise x 20 bil   Other Standing Knee Exercises standing SLR with blue t band for right and left for flex, abd and ext x 15 each     Other Standing Knee Exercises standing and and cone touching with SL squat bil for 3 minutes     Knee/Hip Exercises: Supine   Bridges 15 reps   Bridges Limitations VC for slow and controlled movement   Single Leg Bridge 15 reps  with VC / TC   Other Supine Knee/Hip Exercises floor bridge with physio ball x 15 and ham curls bil x 15     Knee/Hip Exercises: Sidelying  Hip ABduction Both;15 reps   Hip ABduction Limitations start with 30 sec hold and then against wall/ PT with VC and TC for correct alignment                  PT Short Term Goals - 10/03/16 1216      PT SHORT TERM GOAL #1   Title STG=LTG           PT Long Term Goals - 11/05/16 1558      PT LONG TERM GOAL #1   Title Pt will be independent with advanced HEP   Baseline added to HEP with standing exercises   Time 6   Period Weeks   Status On-going     PT LONG TERM GOAL #2   Title Pt will be able to perform squat at knee flexion of at least 90 degrees in 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   Time 6   Period Weeks   Status Unable to assess     PT LONG TERM GOAL #3   Title Pt will be able to bend down and dust under TV stand without exacerbating pain in left knee   Time 6   Period Weeks    Status Unable to assess     PT LONG TERM GOAL #4   Title Pt will be able to return to gym utilizing leg strength  for exericise with MMT in LE's at leadt 4 to 4+/5 in left knee and hips for increase motor control to perform injury free at gym   Time 6   Period Weeks   Status Unable to assess     PT LONG TERM GOAL #5   Title "FOTO will improve from  51% limitation   to   36% limitation  indicating improved functional mobility.    Time 6   Period Weeks   Status Unable to assess               Plan - 11/05/16 1556    Clinical Impression Statement Pt returns today to progress exercises.  She has been able to do all her exercises and has no sharp or stabbing pain but does feel tightness.  She was progressed with blue t band and standing exercises and single limb exercises as tolerated.  Pt will be ready for DC in 1 to 2 visits.    Rehab Potential Excellent   PT Frequency 1x / week   PT Duration 6 weeks   PT Treatment/Interventions Cryotherapy;Electrical Stimulation;Iontophoresis 17m/ml Dexamethasone;Stair training;Ultrasound;Moist Heat;Therapeutic exercise;Neuromuscular re-education;Patient/family education;Passive range of motion;Manual techniques;Dry needling   PT Next Visit Plan Review ex,  hamstring stretch and sit to stand and progress as tolerated   PT Home Exercise Plan step ups lateral and forward , descending,  T band green ham curls and knee ext, quad set, sit to stand, wall slides, cone SLS touches, hip abd against wall,  standing SLR with blue t band flex ext and abd. single limb bridge   Consulted and Agree with Plan of Care Patient      Patient will benefit from skilled therapeutic intervention in order to improve the following deficits and impairments:  Decreased activity tolerance, Decreased mobility, Decreased range of motion, Decreased strength, Abnormal gait, Pain  Visit Diagnosis: Left knee pain, unspecified chronicity  Muscle weakness (generalized)  Other  abnormalities of gait and mobility  Stiffness of left knee, not elsewhere classified     Problem List There are no active  problems to display for this patient.   Voncille Lo, PT Certified Exercise Expert for the Aging Adult  11/05/16 4:08 PM Phone: [phone] Fax: [facility] [address] [place], [place], [address] Phone: [phone]   Fax:  [phone]  Name: Diane Gamble MRN: [id number] Date of Birth: 1980-09-13   PHYSICAL THERAPY DISCHARGE SUMMARY  Visits from Start of Care: 2 Current functional level related to goals / functional outcomes: unknown   Remaining deficits: Unknown,   Education / Equipment: Initial HEP Plan:                                                    Patient goals were not met. Patient is being discharged due to not returning since the last visit.  ?????   At last visit , pt was doing well.  Was left message about attendance policy but never returned  Voncille Lo, PT Certified Exercise Expert for the Aging Adult  01/02/17 4:33 PM Phone: [phone] Fax: [phone]

## 2016-11-07 ENCOUNTER — Encounter: Payer: Medicaid Other | Admitting: Physical Therapy

## 2016-11-12 ENCOUNTER — Ambulatory Visit: Payer: Medicaid Other | Admitting: Physical Therapy

## 2016-11-21 ENCOUNTER — Telehealth: Payer: Self-pay | Admitting: Physical Therapy

## 2016-11-21 ENCOUNTER — Ambulatory Visit: Payer: Medicaid Other | Attending: Orthopedic Surgery | Admitting: Physical Therapy

## 2016-11-21 NOTE — Telephone Encounter (Signed)
Pt called and left message on personal cell phone about attendance policy and need for continuing and attending 2 remaining appointments.  Pt Medicaid covers through 12-04-16 only.  Pt reminded to make 2 additional appt in that time frame.  Pt left message of office number and need to make additional appt.   Garen LahLawrie Beardsley, PT Certified Exercise Expert for the Aging Adult  11/21/16 2:28 PM Phone: [phone] Fax: [phone]

## 2018-06-08 DIAGNOSIS — F419 Anxiety disorder, unspecified: Secondary | ICD-10-CM | POA: Diagnosis not present

## 2018-06-08 DIAGNOSIS — Z6824 Body mass index (BMI) 24.0-24.9, adult: Secondary | ICD-10-CM | POA: Diagnosis not present

## 2018-06-08 DIAGNOSIS — K219 Gastro-esophageal reflux disease without esophagitis: Secondary | ICD-10-CM | POA: Diagnosis not present

## 2018-07-08 DIAGNOSIS — F419 Anxiety disorder, unspecified: Secondary | ICD-10-CM | POA: Diagnosis not present

## 2018-07-08 DIAGNOSIS — Z6824 Body mass index (BMI) 24.0-24.9, adult: Secondary | ICD-10-CM | POA: Diagnosis not present

## 2018-07-08 DIAGNOSIS — K219 Gastro-esophageal reflux disease without esophagitis: Secondary | ICD-10-CM | POA: Diagnosis not present

## 2018-08-05 DIAGNOSIS — Z6824 Body mass index (BMI) 24.0-24.9, adult: Secondary | ICD-10-CM | POA: Diagnosis not present

## 2018-08-05 DIAGNOSIS — D171 Benign lipomatous neoplasm of skin and subcutaneous tissue of trunk: Secondary | ICD-10-CM | POA: Diagnosis not present

## 2018-08-05 DIAGNOSIS — B373 Candidiasis of vulva and vagina: Secondary | ICD-10-CM | POA: Diagnosis not present

## 2018-08-19 DIAGNOSIS — D485 Neoplasm of uncertain behavior of skin: Secondary | ICD-10-CM | POA: Diagnosis not present

## 2018-08-19 DIAGNOSIS — D171 Benign lipomatous neoplasm of skin and subcutaneous tissue of trunk: Secondary | ICD-10-CM | POA: Diagnosis not present

## 2018-08-28 DIAGNOSIS — D171 Benign lipomatous neoplasm of skin and subcutaneous tissue of trunk: Secondary | ICD-10-CM | POA: Diagnosis not present

## 2018-09-17 DIAGNOSIS — E039 Hypothyroidism, unspecified: Secondary | ICD-10-CM | POA: Diagnosis not present

## 2018-09-17 DIAGNOSIS — L83 Acanthosis nigricans: Secondary | ICD-10-CM | POA: Diagnosis not present

## 2018-09-17 DIAGNOSIS — F3181 Bipolar II disorder: Secondary | ICD-10-CM | POA: Diagnosis not present

## 2018-09-22 ENCOUNTER — Encounter (HOSPITAL_COMMUNITY): Payer: Self-pay | Admitting: Emergency Medicine

## 2018-09-22 ENCOUNTER — Other Ambulatory Visit: Payer: Self-pay

## 2018-09-22 ENCOUNTER — Emergency Department (HOSPITAL_COMMUNITY): Payer: Medicaid Other

## 2018-09-22 ENCOUNTER — Emergency Department (HOSPITAL_COMMUNITY)
Admission: EM | Admit: 2018-09-22 | Discharge: 2018-09-22 | Disposition: A | Discharge status: Home / Self Care | Payer: Medicaid Other | Attending: Emergency Medicine | Admitting: Emergency Medicine

## 2018-09-22 DIAGNOSIS — R51 Headache: Secondary | ICD-10-CM

## 2018-09-22 DIAGNOSIS — R1013 Epigastric pain: Secondary | ICD-10-CM | POA: Insufficient documentation

## 2018-09-22 DIAGNOSIS — F17210 Nicotine dependence, cigarettes, uncomplicated: Secondary | ICD-10-CM | POA: Insufficient documentation

## 2018-09-22 DIAGNOSIS — Z79899 Other long term (current) drug therapy: Secondary | ICD-10-CM | POA: Diagnosis not present

## 2018-09-22 DIAGNOSIS — R52 Pain, unspecified: Secondary | ICD-10-CM | POA: Diagnosis not present

## 2018-09-22 DIAGNOSIS — R4020 Unspecified coma: Secondary | ICD-10-CM | POA: Diagnosis not present

## 2018-09-22 DIAGNOSIS — R55 Syncope and collapse: Secondary | ICD-10-CM | POA: Diagnosis not present

## 2018-09-22 DIAGNOSIS — R1084 Generalized abdominal pain: Secondary | ICD-10-CM | POA: Diagnosis not present

## 2018-09-22 DIAGNOSIS — N200 Calculus of kidney: Secondary | ICD-10-CM | POA: Diagnosis not present

## 2018-09-22 DIAGNOSIS — J0100 Acute maxillary sinusitis, unspecified: Secondary | ICD-10-CM | POA: Diagnosis not present

## 2018-09-22 DIAGNOSIS — R519 Headache, unspecified: Secondary | ICD-10-CM

## 2018-09-22 DIAGNOSIS — R1010 Upper abdominal pain, unspecified: Secondary | ICD-10-CM | POA: Diagnosis not present

## 2018-09-22 LAB — CBC WITH DIFFERENTIAL/PLATELET
ABS IMMATURE GRANULOCYTES: 0.04 10*3/uL (ref 0.00–0.07)
BASOS PCT: 0 %
Basophils Absolute: 0.1 10*3/uL (ref 0.0–0.1)
Eosinophils Absolute: 0.4 10*3/uL (ref 0.0–0.5)
Eosinophils Relative: 3 %
HCT: 43.1 % (ref 36.0–46.0)
Hemoglobin: 14.6 g/dL (ref 12.0–15.0)
IMMATURE GRANULOCYTES: 0 %
Lymphocytes Relative: 24 %
Lymphs Abs: 2.8 10*3/uL (ref 0.7–4.0)
MCH: 31.9 pg (ref 26.0–34.0)
MCHC: 33.9 g/dL (ref 30.0–36.0)
MCV: 94.3 fL (ref 80.0–100.0)
MONO ABS: 1.2 10*3/uL — AB (ref 0.1–1.0)
Monocytes Relative: 10 %
NEUTROS ABS: 7.3 10*3/uL (ref 1.7–7.7)
NEUTROS PCT: 63 %
PLATELETS: 291 10*3/uL (ref 150–400)
RBC: 4.57 MIL/uL (ref 3.87–5.11)
RDW: 12.6 % (ref 11.5–15.5)
WBC: 11.8 10*3/uL — AB (ref 4.0–10.5)
nRBC: 0 % (ref 0.0–0.2)

## 2018-09-22 LAB — COMPREHENSIVE METABOLIC PANEL
ALT: 14 U/L (ref 0–44)
AST: 22 U/L (ref 15–41)
Albumin: 4.1 g/dL (ref 3.5–5.0)
Alkaline Phosphatase: 57 U/L (ref 38–126)
Anion gap: 9 (ref 5–15)
BILIRUBIN TOTAL: 0.5 mg/dL (ref 0.3–1.2)
BUN: 9 mg/dL (ref 6–20)
CO2: 21 mmol/L — ABNORMAL LOW (ref 22–32)
Calcium: 9.3 mg/dL (ref 8.9–10.3)
Chloride: 106 mmol/L (ref 98–111)
Creatinine, Ser: 0.68 mg/dL (ref 0.44–1.00)
GFR calc Af Amer: >60 mL/min (ref >60)
Glucose, Bld: 122 mg/dL — ABNORMAL HIGH (ref 70–99)
POTASSIUM: 3.9 mmol/L (ref 3.5–5.1)
Sodium: 136 mmol/L (ref 135–145)
TOTAL PROTEIN: 6.7 g/dL (ref 6.5–8.1)

## 2018-09-22 LAB — I-STAT BETA HCG BLOOD, ED (MC, WL, AP ONLY): I-stat hCG, quantitative: <5 m[IU]/mL (ref <5)

## 2018-09-22 LAB — LIPASE, BLOOD: Lipase: 21 U/L (ref 11–51)

## 2018-09-22 MED ORDER — MORPHINE SULFATE (PF) 4 MG/ML IV SOLN
4.0000 mg | Freq: Once | INTRAVENOUS | Status: AC
  Administered 2018-09-22: 4 mg via INTRAVENOUS
  Filled 2018-09-22: qty 1

## 2018-09-22 MED ORDER — PROMETHAZINE HCL 25 MG PO TABS: 25.0000 mg | ORAL_TABLET | Freq: Four times a day (QID) | ORAL | 0 refills | Status: DC | PRN

## 2018-09-22 MED ORDER — KETOROLAC TROMETHAMINE 30 MG/ML IJ SOLN
30.0000 mg | Freq: Once | INTRAMUSCULAR | Status: AC
  Administered 2018-09-22: 30 mg via INTRAVENOUS
  Filled 2018-09-22: qty 1

## 2018-09-22 MED ORDER — SUCRALFATE 1 G PO TABS: 1.0000 g | ORAL_TABLET | Freq: Three times a day (TID) | ORAL | 0 refills | Status: DC

## 2018-09-22 MED ORDER — AMOXICILLIN-POT CLAVULANATE 875-125 MG PO TABS: 1.0000 | ORAL_TABLET | Freq: Two times a day (BID) | ORAL | 0 refills | Status: DC

## 2018-09-22 MED ORDER — IOHEXOL 300 MG/ML  SOLN
100.0000 mL | Freq: Once | INTRAMUSCULAR | Status: AC | PRN
  Administered 2018-09-22: 100 mL via INTRAVENOUS

## 2018-09-22 MED ORDER — HYDROMORPHONE HCL 1 MG/ML IJ SOLN
1.0000 mg | Freq: Once | INTRAMUSCULAR | Status: AC
  Administered 2018-09-22: 1 mg via INTRAVENOUS
  Filled 2018-09-22: qty 1

## 2018-09-22 MED ORDER — SODIUM CHLORIDE 0.9 % IV BOLUS
1000.0000 mL | Freq: Once | INTRAVENOUS | Status: AC
  Administered 2018-09-22: 1000 mL via INTRAVENOUS

## 2018-09-22 MED ORDER — TRAMADOL HCL 50 MG PO TABS: 50.0000 mg | ORAL_TABLET | Freq: Four times a day (QID) | ORAL | 0 refills | Status: DC | PRN

## 2018-09-22 MED ORDER — ONDANSETRON HCL 4 MG/2ML IJ SOLN
4.0000 mg | Freq: Once | INTRAMUSCULAR | Status: AC
  Administered 2018-09-22: 4 mg via INTRAVENOUS
  Filled 2018-09-22: qty 2

## 2018-09-22 MED ORDER — SODIUM CHLORIDE 0.9% FLUSH: 3.0000 mL | Freq: Once | INTRAVENOUS | Status: DC

## 2018-09-22 MED ORDER — GUAIFENESIN ER 1200 MG PO TB12: 1.0000 | ORAL_TABLET | Freq: Two times a day (BID) | ORAL | 0 refills | Status: DC

## 2018-09-22 MED ORDER — FAMOTIDINE 20 MG PO TABS: 20.0000 mg | ORAL_TABLET | Freq: Two times a day (BID) | ORAL | 0 refills | Status: DC

## 2018-09-22 NOTE — ED Notes (Signed)
Patient transported to CT 

## 2018-09-22 NOTE — ED Notes (Signed)
This RN spoke with lab. The lipase will be added on to the light green already sent down.

## 2018-09-22 NOTE — Discharge Instructions (Addendum)
Return here as needed.  Follow-up with your primary doctor.  Increase your fluid intake.  Rest as much as possible. °

## 2018-09-22 NOTE — ED Triage Notes (Signed)
C/o pain in left side of her face, states it started last week, though she had a  Dental problem went to see her dentist and was told they thought she needed a root canal, had root canal yest. States she was started on antibiotics Sat. States she has been vomiting since sat. Before she started taking her antibiotics. States she feel horrible. States she had a fever of 104 2 weeks ago.

## 2018-09-22 NOTE — ED Provider Notes (Signed)
[facility] EMERGENCY DEPARTMENT Provider Note   CSN: [id number] Arrival date & time: 09/22/18  0447    History   Chief Complaint Chief Complaint  Patient presents with  . Headache  . Facial Swelling    HPI Diane Gamble is a 38 y.o. female.     HPI Patient presents to the emergency department with left-sided facial pain that started about a week ago.  The patient is also been having abdominal pain over the last 2 weeks.  Patient states that she was seen by her dentist who advised she might need a root canals.  She states she got the root canal done yesterday even though the endodontist felt like she did not have any significant abscess or infection.  The patient denies chest pain, shortness of breath, headache,blurred vision, neck pain, fever, cough, weakness, numbness, dizziness, anorexia, edema, abdominal pain, nausea, vomiting, diarrhea, rash, back pain, dysuria, hematemesis, bloody stool, near syncope, or syncope. Past Medical History:  Diagnosis Date  . Anxiety   . PONV (postoperative nausea and vomiting)     There are no active problems to display for this patient.   Past Surgical History:  Procedure Laterality Date  . CYSTOSCOPY WITH HYDRODISTENSION AND BIOPSY  05/05/2007  . KNEE ARTHROSCOPY WITH LATERAL RELEASE Left 08/02/2016   Procedure: LEFT KNEE ARTHROSCOPY WITH LATERAL RELEASE;  Surgeon: Sheral Apley, MD;  Location: [facility];  Service: Orthopedics;  Laterality: Left;  . SYNOVECTOMY Left 08/02/2016   Procedure: SYNOVECTOMY OF LEFT KNEE;  Surgeon: Sheral Apley, MD;  Location: [facility];  Service: Orthopedics;  Laterality: Left;     OB History   No obstetric history on file.      Home Medications    Prior to Admission medications   Medication Sig Start Date End Date Taking? Authorizing Provider  ALPRAZolam Prudy Feeler) 0.5 MG tablet Take 0.5 mg by mouth.    [provider]    amoxicillin-clavulanate (AUGMENTIN) 875-125 MG tablet Take 1 tablet by mouth every 12 (twelve) hours. 09/22/18   Mamie Hundertmark, Cristal Deer, PA-C  aspirin EC 81 MG tablet Take 1 tablet (81 mg total) by mouth daily. Take for 14 days post op for DVT prophylaxis Patient not taking: Reported on 10/03/2016 08/02/16   Albina Billet III, PA-C  docusate sodium (COLACE) 100 MG capsule Take 1 capsule (100 mg total) by mouth 2 (two) times daily. To prevent constipation while taking pain medication. Patient not taking: Reported on 10/03/2016 08/02/16   Albina Billet III, PA-C  famotidine (PEPCID) 20 MG tablet Take 1 tablet (20 mg total) by mouth 2 (two) times daily. 09/22/18   Kalimah Capurro, Cristal Deer, PA-C  Guaifenesin 1200 MG TB12 Take 1 tablet (1,200 mg total) by mouth 2 (two) times daily. 09/22/18   Annayah Worthley, Cristal Deer, PA-C  meloxicam (MOBIC) 15 MG tablet Take 15 mg by mouth daily.    [provider]  methocarbamol (ROBAXIN) 500 MG tablet Take 1 tablet (500 mg total) by mouth every 6 (six) hours as needed for muscle spasms. Patient not taking: Reported on 10/03/2016 08/02/16   Albina Billet III, PA-C  ondansetron (ZOFRAN) 4 MG tablet Take 1 tablet (4 mg total) by mouth every 8 (eight) hours as needed for nausea or vomiting. Patient not taking: Reported on 10/03/2016 08/02/16   Albina Billet III, PA-C  oxyCODONE-acetaminophen (ROXICET) 5-325 MG tablet Take 1-2 tablets by mouth every 4 (four) hours as needed for severe pain. Patient not taking:  Reported on 10/03/2016 08/02/16   Albina Billet III, PA-C  promethazine (PHENERGAN) 25 MG tablet Take 1 tablet (25 mg total) by mouth every 6 (six) hours as needed for nausea or vomiting. 09/22/18   Kavian Peters, Cristal Deer, PA-C  sucralfate (CARAFATE) 1 g tablet Take 1 tablet (1 g total) by mouth 4 (four) times daily -  with meals and at bedtime. 09/22/18   Danita Proud, Cristal Deer, PA-C  traMADol (ULTRAM) 50 MG tablet Take 1 tablet (50 mg total) by  mouth every 6 (six) hours as needed for severe pain. 09/22/18   Falana Clagg, Cristal Deer, PA-C    Family History No family history on file.  Social History Social History   Tobacco Use  . Smoking status: Current Every Day Smoker    Packs/day: 0.50    Types: Cigarettes  . Smokeless tobacco: Never Used  Substance Use Topics  . Alcohol use: Yes    Comment: social  . Drug use: No     Allergies   Patient has no known allergies.   Review of Systems Review of Systems All other systems negative except as documented in the HPI. All pertinent positives and negatives as reviewed in the HPI.  Physical Exam Updated Vital Signs BP 107/66   Pulse 68   Temp 98.1 F (36.7 C) (Oral)   Resp 14   Ht 5\' 6"  (1.676 m)   Wt 68 kg   LMP 09/09/2018   SpO2 98%   BMI 24.21 kg/m   Physical Exam Vitals signs and nursing note reviewed.  Constitutional:      General: She is not in acute distress.    Appearance: She is well-developed.  HENT:     Head: Normocephalic and atraumatic.     Right Ear: Tympanic membrane normal.     Left Ear: Tympanic membrane normal.     Nose:     Left Sinus: Maxillary sinus tenderness present.  Eyes:     Pupils: Pupils are equal, round, and reactive to light.  Neck:     Musculoskeletal: Normal range of motion and neck supple.  Cardiovascular:     Rate and Rhythm: Normal rate and regular rhythm.     Heart sounds: Normal heart sounds. No murmur. No friction rub. No gallop.   Pulmonary:     Effort: Pulmonary effort is normal. No respiratory distress.     Breath sounds: Normal breath sounds. No wheezing.  Abdominal:     General: Bowel sounds are normal. There is no distension.     Palpations: Abdomen is soft.     Tenderness: There is no abdominal tenderness.  Skin:    General: Skin is warm and dry.     Capillary Refill: Capillary refill takes less than 2 seconds.     Findings: No erythema or rash.  Neurological:     Mental Status: She is alert and oriented to  person, place, and time.     Motor: No abnormal muscle tone.     Coordination: Coordination normal.  Psychiatric:        Behavior: Behavior normal.      ED Treatments / Results  Labs (all labs ordered are listed, but only abnormal results are displayed) Labs Reviewed  COMPREHENSIVE METABOLIC PANEL - Abnormal; Notable for the following components:      Result Value   CO2 21 (*)    Glucose, Bld 122 (*)    All other components within normal limits  CBC WITH DIFFERENTIAL/PLATELET - Abnormal; Notable for the following components:  WBC 11.8 (*)    Monocytes Absolute 1.2 (*)    All other components within normal limits  LIPASE, BLOOD  I-STAT BETA HCG BLOOD, ED ([facility], [facility], [facility] ONLY)    EKG None  Radiology Ct Abdomen Pelvis W Contrast  Result Date: 09/22/2018 CLINICAL DATA:  Epigastric pain, nausea, vomiting EXAM: CT ABDOMEN AND PELVIS WITH CONTRAST TECHNIQUE: Multidetector CT imaging of the abdomen and pelvis was performed using the standard protocol following bolus administration of intravenous contrast. CONTRAST:  OMNIPAQUE IOHEXOL 300 MG/ML  SOLN COMPARISON:  None. FINDINGS: Lower chest: No acute abnormality. Hepatobiliary: No focal liver abnormality is seen. No gallstones, gallbladder wall thickening, or biliary dilatation. Pancreas: Unremarkable. No pancreatic ductal dilatation or surrounding inflammatory changes. Spleen: Normal in size without focal abnormality. Adrenals/Urinary Tract: Adrenal glands are unremarkable. Punctuate nonobstructive renal calculi are present bilaterally. Bladder is unremarkable. Stomach/Bowel: Stomach is within normal limits. Appendix appears normal. No evidence of bowel wall thickening, distention, or inflammatory changes. Vascular/Lymphatic: No significant vascular findings are present. No enlarged abdominal or pelvic lymph nodes. Reproductive: No mass or other abnormality. Bilateral cysts or follicles of the ovaries are noted. Other: No abdominal wall  hernia or abnormality. No abdominopelvic ascites. Musculoskeletal: No acute or significant osseous findings. IMPRESSION: 1. No acute CT findings of the abdomen or pelvis to explain epigastric pain, nausea, or vomiting. 2.  Nonobstructive punctuate bilateral nephrolithiasis. Electronically Signed   By: Lauralyn Primes M.D.   On: 09/22/2018 08:47   Ct Maxillofacial W Contrast  Result Date: 09/22/2018 CLINICAL DATA:  Left maxillary pain for several weeks. Root canal yesterday. EXAM: CT MAXILLOFACIAL WITH CONTRAST TECHNIQUE: Multidetector CT imaging of the maxillofacial structures was performed with intravenous contrast. Multiplanar CT image reconstructions were also generated. CONTRAST:  75 mL Omnipaque 300 COMPARISON:  Brain MRI 09/01/2006 FINDINGS: Osseous: No acute fracture or destructive osseous process. Evidence of prior root canal involving the left maxillary first molar tooth and right mandibular third molar. Orbits: Unremarkable. Sinuses: Mild left maxillary sinus mucosal thickening. No fluid. Clear mastoid air cells. Soft tissues: Mild soft tissue thickening overlying the posterior left maxillary alveolar ridge with mild stranding more superiorly in the retroantral fat. No fluid collection. Limited intracranial: Unremarkable. IMPRESSION: Left maxillary molar root canal with mild inflammation in the regional soft tissues. No abscess. Electronically Signed   By: Sebastian Ache M.D.   On: 09/22/2018 08:58    Procedures Procedures (including critical care time)  Medications Ordered in ED Medications  sodium chloride 0.9 % bolus 1,000 mL (0 mLs Intravenous Stopped 09/22/18 1003)  morphine 4 MG/ML injection 4 mg (4 mg Intravenous Given 09/22/18 0655)  ketorolac (TORADOL) 30 MG/ML injection 30 mg (30 mg Intravenous Given 09/22/18 0655)  ondansetron (ZOFRAN) injection 4 mg (4 mg Intravenous Given 09/22/18 0703)  iohexol (OMNIPAQUE) 300 MG/ML solution 100 mL (100 mLs Intravenous Contrast Given 09/22/18 0758)    HYDROmorphone (DILAUDID) injection 1 mg (1 mg Intravenous Given 09/22/18 0835)     Initial Impression / Assessment and Plan / ED Course  I have reviewed the triage vital signs and the nursing notes.  Pertinent labs & imaging results that were available during my care of the patient were reviewed by me and considered in my medical decision making (see chart for details).       Patient was given IV fluids along with pain medications.  The patient is feeling better and we will treat for this mucosal edema in the left maxillary sinus.  Have advised patient  to increase her fluid intake and rest as much as possible.  I have advised the patient to return here for any worsening in her condition.  I do feel that the patient has some gastritis based on the fact that she been taking a lot of ibuprofen for the discomfort.  We will treat with Carafate for this along with Pepcid   Final Clinical Impressions(s) / ED Diagnoses   Final diagnoses:  Facial pain  Acute non-recurrent maxillary sinusitis    ED Discharge Orders         Ordered    amoxicillin-clavulanate (AUGMENTIN) 875-125 MG tablet  Every 12 hours     09/22/18 1003    promethazine (PHENERGAN) 25 MG tablet  Every 6 hours PRN     09/22/18 1003    Guaifenesin 1200 MG TB12  2 times daily     09/22/18 1003    traMADol (ULTRAM) 50 MG tablet  Every 6 hours PRN     09/22/18 1003    sucralfate (CARAFATE) 1 g tablet  3 times daily with meals & bedtime     09/22/18 1003    famotidine (PEPCID) 20 MG tablet  2 times daily     09/22/18 [address], PA-C 09/22/18 1532    Cathren Laine, MD 09/22/18 [phone]

## 2018-10-26 DIAGNOSIS — K219 Gastro-esophageal reflux disease without esophagitis: Secondary | ICD-10-CM | POA: Diagnosis not present

## 2018-10-26 DIAGNOSIS — F419 Anxiety disorder, unspecified: Secondary | ICD-10-CM | POA: Diagnosis not present

## 2019-02-19 DIAGNOSIS — Z6822 Body mass index (BMI) 22.0-22.9, adult: Secondary | ICD-10-CM | POA: Diagnosis not present

## 2019-02-19 DIAGNOSIS — F419 Anxiety disorder, unspecified: Secondary | ICD-10-CM | POA: Diagnosis not present

## 2019-04-20 DIAGNOSIS — Z87891 Personal history of nicotine dependence: Secondary | ICD-10-CM | POA: Diagnosis not present

## 2019-04-20 DIAGNOSIS — Z0000 Encounter for general adult medical examination without abnormal findings: Secondary | ICD-10-CM | POA: Diagnosis not present

## 2019-04-20 DIAGNOSIS — Z1231 Encounter for screening mammogram for malignant neoplasm of breast: Secondary | ICD-10-CM | POA: Diagnosis not present

## 2019-04-20 DIAGNOSIS — F419 Anxiety disorder, unspecified: Secondary | ICD-10-CM | POA: Diagnosis not present

## 2019-04-20 DIAGNOSIS — K219 Gastro-esophageal reflux disease without esophagitis: Secondary | ICD-10-CM | POA: Diagnosis not present

## 2019-04-20 DIAGNOSIS — Z6822 Body mass index (BMI) 22.0-22.9, adult: Secondary | ICD-10-CM | POA: Diagnosis not present

## 2019-04-20 DIAGNOSIS — N898 Other specified noninflammatory disorders of vagina: Secondary | ICD-10-CM | POA: Diagnosis not present

## 2019-05-24 ENCOUNTER — Encounter: Payer: Self-pay | Admitting: Radiology

## 2019-06-10 ENCOUNTER — Encounter: Payer: Medicaid Other | Admitting: Obstetrics and Gynecology

## 2019-06-24 ENCOUNTER — Encounter: Payer: Self-pay | Admitting: *Deleted

## 2019-06-24 ENCOUNTER — Other Ambulatory Visit (HOSPITAL_COMMUNITY)
Admission: RE | Admit: 2019-06-24 | Discharge: 2019-06-24 | Disposition: A | Discharge status: Home / Self Care | Payer: Medicaid Other | Source: Ambulatory Visit | Attending: Obstetrics and Gynecology | Admitting: Obstetrics and Gynecology

## 2019-06-24 ENCOUNTER — Ambulatory Visit (INDEPENDENT_AMBULATORY_CARE_PROVIDER_SITE_OTHER): Payer: Medicaid Other | Admitting: Obstetrics and Gynecology

## 2019-06-24 ENCOUNTER — Other Ambulatory Visit: Payer: Self-pay

## 2019-06-24 ENCOUNTER — Encounter: Payer: Self-pay | Admitting: Obstetrics and Gynecology

## 2019-06-24 ENCOUNTER — Other Ambulatory Visit: Payer: Self-pay | Admitting: Obstetrics and Gynecology

## 2019-06-24 VITALS — BP 112/75 | HR 87 | Ht 66.0 in | Wt 139.0 lb

## 2019-06-24 DIAGNOSIS — Z0000 Encounter for general adult medical examination without abnormal findings: Secondary | ICD-10-CM

## 2019-06-24 DIAGNOSIS — N898 Other specified noninflammatory disorders of vagina: Secondary | ICD-10-CM | POA: Diagnosis not present

## 2019-06-24 DIAGNOSIS — R198 Other specified symptoms and signs involving the digestive system and abdomen: Secondary | ICD-10-CM

## 2019-06-24 DIAGNOSIS — G8929 Other chronic pain: Secondary | ICD-10-CM

## 2019-06-24 DIAGNOSIS — L988 Other specified disorders of the skin and subcutaneous tissue: Secondary | ICD-10-CM | POA: Diagnosis not present

## 2019-06-24 DIAGNOSIS — Z01419 Encounter for gynecological examination (general) (routine) without abnormal findings: Secondary | ICD-10-CM | POA: Diagnosis not present

## 2019-06-24 DIAGNOSIS — N9089 Other specified noninflammatory disorders of vulva and perineum: Secondary | ICD-10-CM | POA: Diagnosis not present

## 2019-06-24 DIAGNOSIS — R1032 Left lower quadrant pain: Secondary | ICD-10-CM

## 2019-06-24 DIAGNOSIS — N200 Calculus of kidney: Secondary | ICD-10-CM

## 2019-06-24 DIAGNOSIS — R102 Pelvic and perineal pain: Secondary | ICD-10-CM

## 2019-06-24 DIAGNOSIS — N943 Premenstrual tension syndrome: Secondary | ICD-10-CM

## 2019-06-24 NOTE — Progress Notes (Signed)
Hx of abnormal pap around 15years  Has had increased in discharge over last month

## 2019-06-24 NOTE — Progress Notes (Signed)
Obstetrics and Gynecology Annual Patient Evaluation  Appointment Date: 06/24/2019  OBGYN Clinic: [facility]  Primary Care Provider: [facility] Chief Complaint:  Chief Complaint  Patient presents with  . Gynecologic Exam    History of Present Illness: Diane Gamble is a 38 y.o. Caucasian G4P0 (Patient's last menstrual period was 06/09/2019 (approximate).), seen for the above chief complaint. Her past medical history is significant for interstitial cystitis, chronic pelvic pain, h/o kidney stones, tobacco  Discharge: x 1 month. No prior s/s. No itching, discomfort, irregular bleeding  Chronic PP: she's been seen by urology and dx with IC in the remote past. She states she's had laparoscopies and nothing seen. Mostly located in the LLQ and doesn't get worse with menses and no real aggravating or alleviating s/s, no radiation. Sometimes dyspareunia in that area.   No breast s/s, nausea, vomiting, abdominal pain, dysuria, hematuria, vaginal itching, diarrhea, constipation, blood in BMs  Review of Systems: as noted in the History of Present Illness.   Past Medical History:  Past Medical History:  Diagnosis Date  . Anxiety   . Cervical dysplasia   . Interstitial cystitis   . PONV (postoperative nausea and vomiting)     Past Surgical History:  Past Surgical History:  Procedure Laterality Date  . CERVIX LESION DESTRUCTION    . CYSTOSCOPY WITH HYDRODISTENSION AND BIOPSY  05/05/2007  . KNEE ARTHROSCOPY WITH LATERAL RELEASE Left 08/02/2016   Procedure: LEFT KNEE ARTHROSCOPY WITH LATERAL RELEASE;  Surgeon: Sheral Apley, MD;  Location: [facility];  Service: Orthopedics;  Laterality: Left;  . SYNOVECTOMY Left 08/02/2016   Procedure: SYNOVECTOMY OF LEFT KNEE;  Surgeon: Sheral Apley, MD;  Location: [facility];  Service: Orthopedics;  Laterality: Left;    Past Obstetrical History:  OB History   Gravida Para Term Preterm AB Living  4         3  SAB TAB Ectopic Multiple Live Births          3    # Outcome Date GA Lbr Len/2nd Weight Sex Delivery Anes PTL Lv  4 Gravida           3 Gravida           2 Gravida           1 Gravida             Obstetric Comments  Vag delivery x 3    Past Gynecological History: As per HPI. Periods: qmonth, regular, somewhat heavy and painful. She does have PMS s/s about a week prior with breast fullness and discomfort History of Pap Smear(s): Yes.   Last pap ?15 years ago, which was ?abnormal She is currently using vasectomy for contraception.   Social History:  Social History   Socioeconomic History  . Marital status: Married    Spouse name: Not on file  . Number of children: Not on file  . Years of education: Not on file  . Highest education level: Not on file  Occupational History  . Not on file  Social Needs  . Financial resource strain: Not on file  . Food insecurity    Worry: Not on file    Inability: Not on file  . Transportation needs    Medical: Not on file    Non-medical: Not on file  Tobacco Use  . Smoking status: Current Some Day Smoker    Packs/day: 0.50    Types: Cigarettes  .  Smokeless tobacco: Never Used  Substance and Sexual Activity  . Alcohol use: Yes    Comment: social  . Drug use: No  . Sexual activity: Yes    Birth control/protection: None    Comment: husband had vasectomy  Lifestyle  . Physical activity    Days per week: Not on file    Minutes per session: Not on file  . Stress: Not on file  Relationships  . Social Musicianconnections    Talks on phone: Not on file    Gets together: Not on file    Attends religious service: Not on file    Active member of club or organization: Not on file    Attends meetings of clubs or organizations: Not on file    Relationship status: Not on file  . Intimate partner violence    Fear of current or ex partner: Not on file    Emotionally abused: Not on file     Physically abused: Not on file    Forced sexual activity: Not on file  Other Topics Concern  . Not on file  Social History Narrative  . Not on file    Family History: History reviewed. No pertinent family history. She denies any female cancers  Health Maintenance:  Mammogram(s): No.   Medications Marzella Schleinngela M. Stair had no medications administered during this visit. Current Outpatient Medications  Medication Sig Dispense Refill  . ALPRAZolam (XANAX) 0.5 MG tablet Take 0.5 mg by mouth.    Marland Kitchen. aspirin EC 81 MG tablet Take 1 tablet (81 mg total) by mouth daily. Take for 14 days post op for DVT prophylaxis (Patient not taking: Reported on 10/03/2016) 14 tablet 0  . docusate sodium (COLACE) 100 MG capsule Take 1 capsule (100 mg total) by mouth 2 (two) times daily. To prevent constipation while taking pain medication. (Patient not taking: Reported on 10/03/2016) 60 capsule 0  . meloxicam (MOBIC) 15 MG tablet Take 15 mg by mouth daily.    . methocarbamol (ROBAXIN) 500 MG tablet Take 1 tablet (500 mg total) by mouth every 6 (six) hours as needed for muscle spasms. (Patient not taking: Reported on 10/03/2016) 40 tablet 0  . sucralfate (CARAFATE) 1 g tablet Take 1 tablet (1 g total) by mouth 4 (four) times daily -  with meals and at bedtime. (Patient not taking: Reported on 06/24/2019) 30 tablet 0   No current facility-administered medications for this visit.     Allergies Patient has no known allergies.   Physical Exam:  BP 112/75   Pulse 87   Ht 5\' 6"  (1.676 m)   Wt 139 lb (63 kg)   LMP 06/09/2019 (Approximate)   BMI 22.44 kg/m  Body mass index is 22.44 kg/m. General appearance: Well nourished, well developed female in no acute distress.  Neck:  Supple, normal appearance, and no thyromegaly  Cardiovascular: normal s1 and s2.  No murmurs, rubs or gallops. Respiratory:  Clear to auscultation bilateral. Normal respiratory effort Abdomen: positive bowel sounds and no masses, hernias; non  distended. Some discomfort in the suprapubic area and llq.  Breasts: breasts appear normal, no suspicious masses, no skin or nipple changes or axillary nodes, and normal palpation. Neuro/Psych:  Normal mood and affect.  Skin:  Warm and dry.  Lymphatic:  No inguinal lymphadenopathy.  Back: sore in LL back but no cvat b/l.   Pelvic exam: is not limited by body habitus EGBUS: within normal limits, 4 o'clock from the labia majora is a  1cm skin  polyp, benign appearing. Vagina: within normal limits and with no blood or discharge in the vault, Cervix: normal appearing cervix without tenderness, discharge or lesions. Uterus:  nonenlarged and non tender and Adnexa:  ?fullness in the LLQ below the asis, mildly ttp, no peritoneal s/s. right side normal.    Rectovaginal: deferred  See procedure note for skin polyp removal  Laboratory: none  Radiology: small stones seen on CT scan from march 2020.   Assessment: pt stable.   Plan:  1. Vaginal discharge - NuSwab VG+, Candida 6sp - Cytology - PAP( [place])  2. Bilateral nephrolithiasis Patient unaware this was seen on CT. No prior s/s. Recommend repeat imaging and if still there, recommend to see urology to see if could be causing pain.  UCx ordered  3. Chronic pelvic pain in female Needs pelvic u/s for this and to evaluate ?fullness in LLQ  4. PMS (premenstrual syndrome) Follow up after imaging to see if she'd like to do any hormonal options about potentially suppressing her cycle  5. LLQ abdominal pain See above  6. Women's annual routine gynecological examination.  - Mammogram Screening Bilateral Tomo; Future - Cytology - PAP( [place])  7. Polyp of skin S/p removal  - Surgical pathology( [place])  Orders Placed This Encounter  Procedures  . Urine Culture-GYN  . Mammogram Screening Bilateral Tomo    RTC after imaging  Durene Romans MD Attending Center for [facility])

## 2019-06-25 ENCOUNTER — Encounter: Payer: Self-pay | Admitting: Obstetrics and Gynecology

## 2019-06-25 DIAGNOSIS — N943 Premenstrual tension syndrome: Secondary | ICD-10-CM | POA: Insufficient documentation

## 2019-06-25 DIAGNOSIS — R1032 Left lower quadrant pain: Secondary | ICD-10-CM | POA: Insufficient documentation

## 2019-06-25 DIAGNOSIS — R102 Pelvic and perineal pain: Secondary | ICD-10-CM | POA: Insufficient documentation

## 2019-06-25 DIAGNOSIS — R198 Other specified symptoms and signs involving the digestive system and abdomen: Secondary | ICD-10-CM | POA: Insufficient documentation

## 2019-06-25 DIAGNOSIS — G8929 Other chronic pain: Secondary | ICD-10-CM | POA: Insufficient documentation

## 2019-06-25 NOTE — Procedures (Signed)
Skin Polyp Procedure Note  Pre-operative Diagnosis: Skin polyp, benign  Post-operative Diagnosis: same. S/p excision  Procedure Details  The risks and benefits of the procedure were explained to the patient and Written informed consent was obtained.  The patient was placed in the dorsal lithotomy position.  The area was cleaned with alcohol and injected with 50mL of 1% lidocaine with epinephrine and then cleaned with betadine. Scissors was used to remove the polyp at the base of the stalk and sent to pathology. Small area then closed with 4-0 Vicryl interrupted sutures   Condition: Stable  Complications: None  Plan: The patient was advised to call for any fever or for prolonged or severe pain or bleeding. She was advised to use stiz baths and keep area clean and dry. She was advised to avoid vaginal intercourse for 2 weeks  Aletha Halim, Brooke Bonito MD Attending Center for [facility])

## 2019-06-28 ENCOUNTER — Ambulatory Visit (INDEPENDENT_AMBULATORY_CARE_PROVIDER_SITE_OTHER): Payer: Medicaid Other

## 2019-06-28 ENCOUNTER — Other Ambulatory Visit: Payer: Self-pay

## 2019-06-28 DIAGNOSIS — R102 Pelvic and perineal pain: Secondary | ICD-10-CM | POA: Diagnosis not present

## 2019-06-28 DIAGNOSIS — G8929 Other chronic pain: Secondary | ICD-10-CM

## 2019-06-28 DIAGNOSIS — Z3401 Encounter for supervision of normal first pregnancy, first trimester: Secondary | ICD-10-CM

## 2019-06-28 LAB — NUSWAB VG+, CANDIDA 6SP
Atopobium vaginae: HIGH Score — AB
C PARAPSILOSIS/TROPICALIS: NEGATIVE
Candida albicans, NAA: NEGATIVE
Candida glabrata, NAA: NEGATIVE
Candida krusei, NAA: NEGATIVE
Candida lusitaniae, NAA: NEGATIVE
Chlamydia trachomatis, NAA: NEGATIVE
Megasphaera 1: HIGH Score — AB
Neisseria gonorrhoeae, NAA: NEGATIVE
Trich vag by NAA: NEGATIVE

## 2019-06-28 LAB — CYTOLOGY - PAP
Comment: NEGATIVE
Diagnosis: NEGATIVE
High risk HPV: NEGATIVE

## 2019-06-28 NOTE — Progress Notes (Signed)
Patient did not leave a urine last week. She came by to drop of urine for urine culture.

## 2019-06-29 LAB — URINE CULTURE

## 2019-06-30 ENCOUNTER — Ambulatory Visit
Admission: RE | Admit: 2019-06-30 | Discharge: 2019-06-30 | Disposition: A | Discharge status: Home / Self Care | Payer: Medicaid Other | Source: Ambulatory Visit | Attending: Obstetrics and Gynecology | Admitting: Obstetrics and Gynecology

## 2019-06-30 ENCOUNTER — Other Ambulatory Visit: Payer: Self-pay

## 2019-06-30 DIAGNOSIS — Z1231 Encounter for screening mammogram for malignant neoplasm of breast: Secondary | ICD-10-CM | POA: Insufficient documentation

## 2019-06-30 DIAGNOSIS — Z01419 Encounter for gynecological examination (general) (routine) without abnormal findings: Secondary | ICD-10-CM | POA: Insufficient documentation

## 2019-07-05 ENCOUNTER — Telehealth: Payer: Self-pay | Admitting: *Deleted

## 2019-07-05 MED ORDER — METRONIDAZOLE 500 MG PO TABS: 500.0000 mg | ORAL_TABLET | Freq: Two times a day (BID) | ORAL | 0 refills | Status: AC

## 2019-07-05 NOTE — Telephone Encounter (Signed)
Pt informed of results and meds sent into pharmacy.

## 2019-07-05 NOTE — Addendum Note (Signed)
Addended by: Aletha Halim on: 07/05/2019 01:44 PM   Modules accepted: Orders

## 2019-07-05 NOTE — Telephone Encounter (Signed)
-----   Message from Aletha Halim, MD sent at 07/05/2019  1:43 PM EST ----- Can you let her know that she has bv and flagyl was sent in for her? Thanks and that her pap smear is negative and to repeat it in 3-5 years.

## 2019-07-21 ENCOUNTER — Other Ambulatory Visit: Payer: Self-pay

## 2019-07-21 ENCOUNTER — Ambulatory Visit
Admission: RE | Admit: 2019-07-21 | Discharge: 2019-07-21 | Disposition: A | Discharge status: Home / Self Care | Payer: Medicaid Other | Source: Ambulatory Visit | Attending: Obstetrics and Gynecology | Admitting: Obstetrics and Gynecology

## 2019-07-21 DIAGNOSIS — R1032 Left lower quadrant pain: Secondary | ICD-10-CM | POA: Insufficient documentation

## 2019-07-21 DIAGNOSIS — N200 Calculus of kidney: Secondary | ICD-10-CM | POA: Diagnosis not present

## 2019-07-21 DIAGNOSIS — R102 Pelvic and perineal pain: Secondary | ICD-10-CM | POA: Insufficient documentation

## 2019-07-21 DIAGNOSIS — G8929 Other chronic pain: Secondary | ICD-10-CM | POA: Diagnosis not present

## 2019-07-21 DIAGNOSIS — R198 Other specified symptoms and signs involving the digestive system and abdomen: Secondary | ICD-10-CM | POA: Insufficient documentation

## 2019-07-29 ENCOUNTER — Encounter: Payer: Self-pay | Admitting: Obstetrics and Gynecology

## 2019-07-29 ENCOUNTER — Other Ambulatory Visit: Payer: Self-pay

## 2019-07-29 ENCOUNTER — Ambulatory Visit (INDEPENDENT_AMBULATORY_CARE_PROVIDER_SITE_OTHER): Payer: Medicaid Other | Admitting: Obstetrics and Gynecology

## 2019-07-29 VITALS — BP 127/84 | HR 72 | Wt 141.0 lb

## 2019-07-29 DIAGNOSIS — N200 Calculus of kidney: Secondary | ICD-10-CM | POA: Diagnosis not present

## 2019-07-29 NOTE — Progress Notes (Signed)
Obstetrics and Gynecology Visit Return Patient Evaluation  Appointment Date: 07/29/2019  Primary Care Provider: [facility]: [facility]  Chief Complaint: ultrasound follow up visit  History of Present Illness:  Diane Gamble is a 39 y.o. with above CC.   Patient initially seen by me on 12/3 for chronic pelvic pain and vaginal discharge. Swabs and paps negative except +BV. Pelvic u/s negative and CT renal showed several small 1-54mm b/l renal stones   Interval History: Since that time, she states that she just started the flagyl and thinks the d/c is getting better  Review of Systems: as noted in the History of Present Illness.  Patient Active Problem List   Diagnosis Date Noted  . Fullness of abdomen 06/25/2019  . LLQ abdominal pain 06/25/2019  . PMS (premenstrual syndrome) 06/25/2019  . Chronic pelvic pain in female 06/25/2019  . Bilateral nephrolithiasis 06/24/2019  . Vaginal discharge 06/24/2019   Medications:  [name] had no medications administered during this visit. Current Outpatient Medications  Medication Sig Dispense Refill  . docusate sodium (COLACE) 100 MG capsule Take 1 capsule (100 mg total) by mouth 2 (two) times daily. To prevent constipation while taking pain medication. 60 capsule 0  . ALPRAZolam (XANAX) 0.5 MG tablet Take 0.5 mg by mouth.    Marland Kitchen aspirin EC 81 MG tablet Take 1 tablet (81 mg total) by mouth daily. Take for 14 days post op for DVT prophylaxis (Patient not taking: Reported on 10/03/2016) 14 tablet 0  . meloxicam (MOBIC) 15 MG tablet Take 15 mg by mouth daily.    . methocarbamol (ROBAXIN) 500 MG tablet Take 1 tablet (500 mg total) by mouth every 6 (six) hours as needed for muscle spasms. (Patient not taking: Reported on 07/29/2019) 40 tablet 0  . sucralfate (CARAFATE) 1 g tablet Take 1 tablet (1 g total) by mouth 4 (four) times daily -  with meals and at bedtime. (Patient not taking: Reported  on 07/29/2019) 30 tablet 0   No current facility-administered medications for this visit.    Allergies: has No Known Allergies.  Physical Exam:  BP 127/84   Pulse 72   Wt 141 lb (64 kg)   BMI 22.76 kg/m  Body mass index is 22.76 kg/m. General appearance: Well nourished, well developed female in no acute distress.  Abdomen: diffusely non tender to palpation, non distended, and no masses, hernias Back: no CVAT Neuro/Psych:  Normal mood and affect.    Radiology: no new imaging CLINICAL DATA:  Worsening left groin and left lower quadrant pain. Nephrolithiasis.  EXAM: CT ABDOMEN AND PELVIS WITHOUT CONTRAST  TECHNIQUE: Multidetector CT imaging of the abdomen and pelvis was performed following the standard protocol without IV contrast.  COMPARISON:  09/22/2018 from [facility].  FINDINGS: Lower chest: No acute findings.  Hepatobiliary: No mass visualized on this unenhanced exam. Stable tiny sub-cm cysts in the left lobe. Gallbladder is unremarkable. No evidence of biliary ductal dilatation.  Pancreas: No mass or inflammatory process visualized on this unenhanced exam.  Spleen:  Within normal limits in size.  Adrenals/Urinary tract: Several tiny 1-2 mm renal calculi are again seen bilaterally. No evidence of ureteral calculi or hydronephrosis. Unremarkable unopacified urinary bladder.  Stomach/Bowel: No evidence of obstruction, inflammatory process, or abnormal fluid collections. Normal appendix visualized.  Vascular/Lymphatic: No pathologically enlarged lymph nodes identified. No evidence of abdominal aortic aneurysm.  Reproductive:  No mass or other significant abnormality.  Other:  None.  Musculoskeletal:  No suspicious bone lesions identified.  IMPRESSION: Tiny bilateral renal calculi. No evidence of ureteral calculi, hydronephrosis, or other acute findings.   Electronically Signed   By: Danae Orleans M.D.   On: 07/21/2019  15:20  CLINICAL DATA:  Chronic pelvic pain in female. Left lower quadrant pain. Abdominal fullness.  EXAM: TRANSABDOMINAL AND TRANSVAGINAL ULTRASOUND OF PELVIS  TECHNIQUE: Both transabdominal and transvaginal ultrasound examinations of the pelvis were performed. Transabdominal technique was performed for global imaging of the pelvis including uterus, ovaries, adnexal regions, and pelvic cul-de-sac. It was necessary to proceed with endovaginal exam following the transabdominal exam to visualize the endometrium and ovaries.  COMPARISON:  None  FINDINGS: Uterus  Measurements: 8.2 x 4.0 x 4.3 cm = volume: 72 mL. No fibroids or other mass visualized.  Endometrium  Thickness: 10 mm.  No focal abnormality visualized.  Right ovary  Measurements: 3.4 x 1.8 x 3.3 cm = volume: 10.7 mL. Normal appearance/no adnexal mass.  Left ovary  Measurements: 3.6 x 1.0 x 1.9 cm = volume: 3.6 mL. Normal appearance/no adnexal mass.  Other findings  No abnormal free fluid.  IMPRESSION: Normal appearance of uterus and both ovaries. No pelvic mass or other significant abnormality identified.   Electronically Signed   By: Danae Orleans M.D.   On: 07/21/2019 14:54  CLINICAL DATA:  Worsening left groin and left lower quadrant pain. Nephrolithiasis.  EXAM: CT ABDOMEN AND PELVIS WITHOUT CONTRAST  TECHNIQUE: Multidetector CT imaging of the abdomen and pelvis was performed following the standard protocol without IV contrast.  COMPARISON:  09/22/2018 from [facility].  FINDINGS: Lower chest: No acute findings.  Hepatobiliary: No mass visualized on this unenhanced exam. Stable tiny sub-cm cysts in the left lobe. Gallbladder is unremarkable. No evidence of biliary ductal dilatation.  Pancreas: No mass or inflammatory process visualized on this unenhanced exam.  Spleen:  Within normal limits in size.  Adrenals/Urinary tract: Several tiny 1-2 mm renal  calculi are again seen bilaterally. No evidence of ureteral calculi or hydronephrosis. Unremarkable unopacified urinary bladder.  Stomach/Bowel: No evidence of obstruction, inflammatory process, or abnormal fluid collections. Normal appendix visualized.  Vascular/Lymphatic: No pathologically enlarged lymph nodes identified. No evidence of abdominal aortic aneurysm.  Reproductive:  No mass or other significant abnormality.  Other:  None.  Musculoskeletal:  No suspicious bone lesions identified.  IMPRESSION: Tiny bilateral renal calculi. No evidence of ureteral calculi, hydronephrosis, or other acute findings.   Electronically Signed   By: Danae Orleans M.D.   On: 07/21/2019 15:20  Assessment: pt stable  Plan:  1. Bilateral nephrolithiasis D/w her that no obvious gyn etiology and i'm not sure the small stones could be the etiology of her pain but that it should be investigated to see. Pt is amenable to plan. Will place urology referral - Ambulatory referral to Urology   RTC: PRN  Cornelia Copa MD Attending Center for [facility])

## 2019-08-05 DIAGNOSIS — E785 Hyperlipidemia, unspecified: Secondary | ICD-10-CM | POA: Diagnosis not present

## 2019-08-05 DIAGNOSIS — Z6822 Body mass index (BMI) 22.0-22.9, adult: Secondary | ICD-10-CM | POA: Diagnosis not present

## 2019-08-05 DIAGNOSIS — Z79899 Other long term (current) drug therapy: Secondary | ICD-10-CM | POA: Diagnosis not present

## 2019-08-05 DIAGNOSIS — R109 Unspecified abdominal pain: Secondary | ICD-10-CM | POA: Diagnosis not present

## 2019-08-05 DIAGNOSIS — F419 Anxiety disorder, unspecified: Secondary | ICD-10-CM | POA: Diagnosis not present

## 2019-08-27 ENCOUNTER — Encounter: Payer: Self-pay | Admitting: Urology

## 2019-08-27 ENCOUNTER — Other Ambulatory Visit: Payer: Self-pay

## 2019-08-27 ENCOUNTER — Ambulatory Visit (INDEPENDENT_AMBULATORY_CARE_PROVIDER_SITE_OTHER): Payer: Medicaid Other | Admitting: Urology

## 2019-08-27 VITALS — BP 110/68 | HR 70 | Ht 66.0 in | Wt 135.0 lb

## 2019-08-27 DIAGNOSIS — N200 Calculus of kidney: Secondary | ICD-10-CM | POA: Diagnosis not present

## 2019-08-27 LAB — MICROSCOPIC EXAMINATION: Bacteria, UA: NONE SEEN

## 2019-08-27 LAB — URINALYSIS, COMPLETE
Bilirubin, UA: NEGATIVE
Glucose, UA: NEGATIVE
Ketones, UA: NEGATIVE
Leukocytes,UA: NEGATIVE
Nitrite, UA: NEGATIVE
Protein,UA: NEGATIVE
Specific Gravity, UA: 1.025 (ref 1.005–1.030)
Urobilinogen, Ur: 0.2 mg/dL (ref 0.2–1.0)
pH, UA: 5.5 (ref 5.0–7.5)

## 2019-08-27 NOTE — Progress Notes (Signed)
08/27/2019 12:13 PM   Tally Due Jan 31, 1981 [id number]  Referring provider: Aletha Halim, MD [address] [place],  [place] [address]  Chief Complaint  Patient presents with  . Nephrolithiasis    HPI: Diane Gamble is a 39 y.o. female seen in consultation at the request of Dr. Ilda Basset for evaluation of nephrolithiasis.  She states she has a 9-year history of chronic pelvic pain which she has learned to deal with.  Prior urologic history remarkable for cystoscopy with hydrodistention by Dr. Matilde Sprang in 2012 with findings felt consistent with interstitial cystitis.  His op note indicated would treat for IC however she states she has not had urologic follow-up in [facility] for several years and is not on urologic medication.  She had a CT of the abdomen pelvis performed March 2020 which showed bilateral, nonobstructing renal calculi.  She was recently having some worsening LLQ/groin pain and a CT was repeated on 07/21/2019 which showed no hydronephrosis or ureteral calculi.  She had stable, nonobstructing renal calculi.  She did have an episode a few months ago of severe left flank pain which radiated to the left groin region and was associated with nausea which resolved in 24 hours.  She denies dysuria or gross hematuria.  No bothersome lower urinary tract symptoms.  She has no prior history of stone disease.   PMH: Past Medical History:  Diagnosis Date  . Anxiety   . Cervical dysplasia   . Interstitial cystitis   . PONV (postoperative nausea and vomiting)     Surgical History: Past Surgical History:  Procedure Laterality Date  . CERVIX LESION DESTRUCTION    . CYSTOSCOPY WITH HYDRODISTENSION AND BIOPSY  05/05/2007  . KNEE ARTHROSCOPY WITH LATERAL RELEASE Left 08/02/2016   Procedure: LEFT KNEE ARTHROSCOPY WITH LATERAL RELEASE;  Surgeon: Renette Butters, MD;  Location: [facility];  Service: Orthopedics;  Laterality: Left;  . SYNOVECTOMY Left  08/02/2016   Procedure: SYNOVECTOMY OF LEFT KNEE;  Surgeon: Renette Butters, MD;  Location: [facility];  Service: Orthopedics;  Laterality: Left;    Home Medications:  Allergies as of 08/27/2019   No Known Allergies     Medication List       Accurate as of August 27, 2019 12:13 PM. If you have any questions, ask your nurse or doctor.        ALPRAZolam 0.5 MG tablet Commonly known as: XANAX Take 0.5 mg by mouth.   aspirin EC 81 MG tablet Take 1 tablet (81 mg total) by mouth daily. Take for 14 days post op for DVT prophylaxis   docusate sodium 100 MG capsule Commonly known as: Colace Take 1 capsule (100 mg total) by mouth 2 (two) times daily. To prevent constipation while taking pain medication.   meloxicam 15 MG tablet Commonly known as: MOBIC Take 15 mg by mouth daily.   methocarbamol 500 MG tablet Commonly known as: Robaxin Take 1 tablet (500 mg total) by mouth every 6 (six) hours as needed for muscle spasms.   sucralfate 1 g tablet Commonly known as: Carafate Take 1 tablet (1 g total) by mouth 4 (four) times daily -  with meals and at bedtime.       Allergies: No Known Allergies  Family History: History reviewed. No pertinent family history.  Social History:  reports that she has been smoking cigarettes. She has been smoking about 0.50 packs per day. She has never used smokeless tobacco. She reports current alcohol  use. She reports that she does not use drugs.  ROS: UROLOGY Frequent Urination?: No Hard to postpone urination?: No Burning/pain with urination?: Yes Get up at night to urinate?: No Leakage of urine?: No Urine stream starts and stops?: No Trouble starting stream?: No Do you have to strain to urinate?: No Blood in urine?: No Urinary tract infection?: No Sexually transmitted disease?: No Injury to kidneys or bladder?: No Painful intercourse?: No Weak stream?: No Currently pregnant?: No Vaginal bleeding?: No Last menstrual  period?: n  Gastrointestinal Nausea?: No Vomiting?: No Indigestion/heartburn?: Yes Diarrhea?: No Constipation?: No  Constitutional Fever: No Night sweats?: Yes Weight loss?: No Fatigue?: No  Skin Skin rash/lesions?: No Itching?: No  Eyes Blurred vision?: No Double vision?: No  Ears/Nose/Throat Sore throat?: No Sinus problems?: No  Hematologic/Lymphatic Swollen glands?: No Easy bruising?: Yes  Cardiovascular Leg swelling?: No Chest pain?: No  Respiratory Cough?: No Shortness of breath?: No  Endocrine Excessive thirst?: No  Musculoskeletal Back pain?: No Joint pain?: No  Neurological Headaches?: No Dizziness?: No  Psychologic Depression?: No Anxiety?: Yes  Physical Exam: BP 110/68   Pulse 70   Ht 5\' 6"  (1.676 m)   Wt 135 lb (61.2 kg)   BMI 21.79 kg/m   Constitutional:  Alert and oriented, No acute distress. HEENT:  AT, moist mucus membranes.  Trachea midline, no masses. Cardiovascular: No clubbing, cyanosis, or edema. Respiratory: Normal respiratory effort, no increased work of breathing. Skin: No rashes, bruises or suspicious lesions. Neurologic: Grossly intact, no focal deficits, moving all 4 extremities. Psychiatric: Normal mood and affect.   Pertinent Imaging: CT was personally reviewed.  There is a single 2 mm nonobstructing left renal calculus and a 3 mm nonobstructing right renal calculus best seen on coronal images.  No ureteral calculi or hydronephrosis.   Results for orders placed during the hospital encounter of 07/21/19  CT RENAL STONE STUDY   Narrative CLINICAL DATA:  Worsening left groin and left lower quadrant pain. Nephrolithiasis.  EXAM: CT ABDOMEN AND PELVIS WITHOUT CONTRAST  TECHNIQUE: Multidetector CT imaging of the abdomen and pelvis was performed following the standard protocol without IV contrast.  COMPARISON:  09/22/2018 from [facility].  FINDINGS: Lower chest: No acute  findings.  Hepatobiliary: No mass visualized on this unenhanced exam. Stable tiny sub-cm cysts in the left lobe. Gallbladder is unremarkable. No evidence of biliary ductal dilatation.  Pancreas: No mass or inflammatory process visualized on this unenhanced exam.  Spleen:  Within normal limits in size.  Adrenals/Urinary tract: Several tiny 1-2 mm renal calculi are again seen bilaterally. No evidence of ureteral calculi or hydronephrosis. Unremarkable unopacified urinary bladder.  Stomach/Bowel: No evidence of obstruction, inflammatory process, or abnormal fluid collections. Normal appendix visualized.  Vascular/Lymphatic: No pathologically enlarged lymph nodes identified. No evidence of abdominal aortic aneurysm.  Reproductive:  No mass or other significant abnormality.  Other:  None.  Musculoskeletal:  No suspicious bone lesions identified.  IMPRESSION: Tiny bilateral renal calculi. No evidence of ureteral calculi, hydronephrosis, or other acute findings.   Electronically Signed   By: [facility] M.D.   On: 07/21/2019 15:20     Assessment & Plan:    - Bilateral nephrolithiasis Bilateral, nonobstructing renal calculi.  Her current stones would not be a source of pain.  Her previous episode a few months ago may have been secondary to a small calculus that she passed.  It was recommended she increase her water intake to keep urine output greater than 2 L per  day.  Typically 3 L consumption is generally enough to produce this output.  Oxalate moderation was discussed and she was provided literature on high oxalate foods and beverages.  Avoidance of salty foods and added salt was discussed as well as avoidance of excessive intake of animal protein.  Increased intake of potassium rich citrus products was recommended.  Return for 1 yr follow up w [facility] prior.    Riki Altes, MD  [facility] [address] [place], [place]  [address] [phone]

## 2019-10-01 ENCOUNTER — Ambulatory Visit (INDEPENDENT_AMBULATORY_CARE_PROVIDER_SITE_OTHER): Payer: Medicaid Other | Admitting: *Deleted

## 2019-10-01 ENCOUNTER — Other Ambulatory Visit: Payer: Self-pay

## 2019-10-01 VITALS — BP 115/77 | HR 71

## 2019-10-01 DIAGNOSIS — R3915 Urgency of urination: Secondary | ICD-10-CM | POA: Diagnosis not present

## 2019-10-01 DIAGNOSIS — R300 Dysuria: Secondary | ICD-10-CM

## 2019-10-01 LAB — POCT URINALYSIS DIPSTICK

## 2019-10-01 MED ORDER — PHENAZOPYRIDINE HCL 200 MG PO TABS: 200.0000 mg | ORAL_TABLET | Freq: Three times a day (TID) | ORAL | 1 refills | Status: DC | PRN

## 2019-10-01 MED ORDER — NITROFURANTOIN MONOHYD MACRO 100 MG PO CAPS: 100.0000 mg | ORAL_CAPSULE | Freq: Two times a day (BID) | ORAL | 1 refills | Status: DC

## 2019-10-01 NOTE — Progress Notes (Signed)
SUBJECTIVE: Diane Gamble is a 39 y.o. female who complains of urinary frequency, urgency and dysuria x 2 days, without flank pain, fever, chills, or abnormal vaginal discharge or bleeding.   OBJECTIVE: Appears well, in no apparent distress.  Vital signs are normal. Urine dipstick shows positive for RBC's and positive for leukocytes.    ASSESSMENT: Dysuria  PLAN: Treatment per orders.  Call or return to clinic prn if these symptoms worsen or fail to improve as anticipated.

## 2019-10-04 NOTE — Progress Notes (Signed)
Patient seen and assessed by nursing staff during this encounter. I have reviewed the chart and agree with the documentation and plan.  Jaynie Collins, MD 10/04/2019 8:59 AM

## 2019-10-05 LAB — URINE CULTURE

## 2019-10-11 ENCOUNTER — Telehealth: Payer: Self-pay | Admitting: *Deleted

## 2019-10-11 MED ORDER — CEFADROXIL 500 MG PO CAPS: 500.0000 mg | ORAL_CAPSULE | Freq: Two times a day (BID) | ORAL | 0 refills | Status: DC

## 2019-10-11 NOTE — Telephone Encounter (Signed)
Pt called and feels that UTI is not completely better, will reach out to provider. Will send in new RX per Dr Macon Large.

## 2019-10-12 ENCOUNTER — Other Ambulatory Visit: Payer: Self-pay

## 2019-10-12 ENCOUNTER — Emergency Department: Payer: Medicaid Other

## 2019-10-12 ENCOUNTER — Emergency Department
Admission: EM | Admit: 2019-10-12 | Discharge: 2019-10-12 | Disposition: A | Discharge status: Home / Self Care | Payer: Medicaid Other | Attending: Emergency Medicine | Admitting: Emergency Medicine

## 2019-10-12 ENCOUNTER — Encounter: Payer: Self-pay | Admitting: Emergency Medicine

## 2019-10-12 DIAGNOSIS — Z20822 Contact with and (suspected) exposure to covid-19: Secondary | ICD-10-CM | POA: Diagnosis not present

## 2019-10-12 DIAGNOSIS — F17210 Nicotine dependence, cigarettes, uncomplicated: Secondary | ICD-10-CM | POA: Insufficient documentation

## 2019-10-12 DIAGNOSIS — Z79899 Other long term (current) drug therapy: Secondary | ICD-10-CM | POA: Diagnosis not present

## 2019-10-12 DIAGNOSIS — R112 Nausea with vomiting, unspecified: Secondary | ICD-10-CM | POA: Diagnosis not present

## 2019-10-12 DIAGNOSIS — R109 Unspecified abdominal pain: Secondary | ICD-10-CM | POA: Insufficient documentation

## 2019-10-12 DIAGNOSIS — R300 Dysuria: Secondary | ICD-10-CM | POA: Diagnosis not present

## 2019-10-12 DIAGNOSIS — N200 Calculus of kidney: Secondary | ICD-10-CM | POA: Diagnosis not present

## 2019-10-12 LAB — CBC
HCT: 40.5 % (ref 36.0–46.0)
Hemoglobin: 13.8 g/dL (ref 12.0–15.0)
MCH: 32 pg (ref 26.0–34.0)
MCHC: 34.1 g/dL (ref 30.0–36.0)
MCV: 94 fL (ref 80.0–100.0)
Platelets: 273 10*3/uL (ref 150–400)
RBC: 4.31 MIL/uL (ref 3.87–5.11)
RDW: 12.6 % (ref 11.5–15.5)
WBC: 6.6 10*3/uL (ref 4.0–10.5)
nRBC: 0 % (ref 0.0–0.2)

## 2019-10-12 LAB — COMPREHENSIVE METABOLIC PANEL
ALT: 11 U/L (ref 0–44)
AST: 15 U/L (ref 15–41)
Albumin: 4.1 g/dL (ref 3.5–5.0)
Alkaline Phosphatase: 51 U/L (ref 38–126)
Anion gap: 7 (ref 5–15)
BUN: 11 mg/dL (ref 6–20)
CO2: 24 mmol/L (ref 22–32)
Calcium: 8.8 mg/dL — ABNORMAL LOW (ref 8.9–10.3)
Chloride: 108 mmol/L (ref 98–111)
Creatinine, Ser: 0.56 mg/dL (ref 0.44–1.00)
GFR calc Af Amer: >60 mL/min (ref >60)
GFR calc non Af Amer: >60 mL/min (ref >60)
Glucose, Bld: 105 mg/dL — ABNORMAL HIGH (ref 70–99)
Potassium: 3.9 mmol/L (ref 3.5–5.1)
Sodium: 139 mmol/L (ref 135–145)
Total Bilirubin: 0.7 mg/dL (ref 0.3–1.2)
Total Protein: 6.9 g/dL (ref 6.5–8.1)

## 2019-10-12 LAB — WET PREP, GENITAL
Clue Cells Wet Prep HPF POC: NONE SEEN
Sperm: NONE SEEN
Trich, Wet Prep: NONE SEEN
WBC, Wet Prep HPF POC: NONE SEEN
Yeast Wet Prep HPF POC: NONE SEEN

## 2019-10-12 LAB — URINALYSIS, COMPLETE (UACMP) WITH MICROSCOPIC
Bacteria, UA: NONE SEEN
Bilirubin Urine: NEGATIVE
Glucose, UA: NEGATIVE mg/dL
Hgb urine dipstick: NEGATIVE
Ketones, ur: NEGATIVE mg/dL
Leukocytes,Ua: NEGATIVE
Nitrite: NEGATIVE
Protein, ur: NEGATIVE mg/dL
Specific Gravity, Urine: 1.02 (ref 1.005–1.030)
pH: 6 (ref 5.0–8.0)

## 2019-10-12 LAB — SARS CORONAVIRUS 2 (TAT 6-24 HRS): SARS Coronavirus 2: NEGATIVE

## 2019-10-12 LAB — CHLAMYDIA/NGC RT PCR (ARMC ONLY)
Chlamydia Tr: NOT DETECTED
N gonorrhoeae: NOT DETECTED

## 2019-10-12 MED ORDER — HYDROMORPHONE HCL 1 MG/ML IJ SOLN
0.5000 mg | Freq: Once | INTRAMUSCULAR | Status: AC
  Administered 2019-10-12: 0.5 mg via INTRAVENOUS
  Filled 2019-10-12: qty 1

## 2019-10-12 MED ORDER — HYDROCODONE-ACETAMINOPHEN 5-325 MG PO TABS: 1.0000 | ORAL_TABLET | Freq: Four times a day (QID) | ORAL | 0 refills | Status: DC | PRN

## 2019-10-12 MED ORDER — ONDANSETRON HCL 4 MG/2ML IJ SOLN
4.0000 mg | Freq: Once | INTRAMUSCULAR | Status: AC
  Administered 2019-10-12: 11:00:00 4 mg via INTRAVENOUS
  Filled 2019-10-12: qty 2

## 2019-10-12 MED ORDER — CEPHALEXIN 500 MG PO CAPS: 500.0000 mg | ORAL_CAPSULE | Freq: Three times a day (TID) | ORAL | 0 refills | Status: DC

## 2019-10-12 MED ORDER — ONDANSETRON 4 MG PO TBDP: 4.0000 mg | ORAL_TABLET | Freq: Three times a day (TID) | ORAL | 0 refills | Status: DC | PRN

## 2019-10-12 MED ORDER — SODIUM CHLORIDE 0.9 % IV BOLUS
1000.0000 mL | Freq: Once | INTRAVENOUS | Status: AC
  Administered 2019-10-12: 1000 mL via INTRAVENOUS

## 2019-10-12 NOTE — Discharge Instructions (Addendum)
Follow-up with your primary care provider if any continued problems or concerns.  Urinalysis does not show any infection at this time.  Discontinue taking the Duricef which could cause nausea and vomiting.  A prescription for Keflex which is an antibiotic was sent to your pharmacy.  Zofran ODT if needed for nausea or vomiting.  Increase fluids to stay hydrated.  A prescription for Norco as needed for pain.  This medication is a narcotic and you should not drive or operate machinery while taking this medication.  The Covid test that was done today will result on my chart and you will be able to see the results.  Should the test results be positive you will need to quarantine for a minimum of 10 days.

## 2019-10-12 NOTE — ED Triage Notes (Signed)
Pt reports was treated for a UTI, took an abx but it was not getting better so they put her on a new abx. Pt reports she feels dizzy and is having pain to her back and abd and some burning with utrination.

## 2019-10-12 NOTE — ED Provider Notes (Addendum)
[facility] Emergency Department Provider Note   ____________________________________________   First MD Initiated Contact with Patient 10/12/19 1018     (approximate)  I have reviewed the triage vital signs and the nursing notes.   HISTORY  Chief Complaint Dizziness, Back Pain, Urinary Tract Infection, and Headache   HPI Diane Gamble is a 39 y.o. female presents to the ED with complaint of abdominal discomfort and dizziness.  Patient states that she was seen at her PCP at [facility] where she was diagnosed with a UTI after complaint of dysuria.  She states that she called yesterday stating that she was not getting any better and they switched her antibiotic from Macrodantin to [facility] which she states she believes is poisoning her.  Patient reports that she had nausea and vomiting all night.  She denies any diarrhea.  There has been no known fever, chills or known exposure to Covid.  Patient states that she is dizzy and weak.  She denies any vaginal bleeding or discharge.  Looking through her previous records she has history of chronic pelvic pain and also has already seen Dr. Lonna Cobb for bilateral nephrolithiasis that is nonobstructing.  She rates her pain as 7 out of 10.       Past Medical History:  Diagnosis Date  . Anxiety   . Cervical dysplasia   . Interstitial cystitis   . PONV (postoperative nausea and vomiting)     Patient Active Problem List   Diagnosis Date Noted  . Fullness of abdomen 06/25/2019  . LLQ abdominal pain 06/25/2019  . PMS (premenstrual syndrome) 06/25/2019  . Chronic pelvic pain in female 06/25/2019  . Bilateral nephrolithiasis 06/24/2019  . Vaginal discharge 06/24/2019    Past Surgical History:  Procedure Laterality Date  . CERVIX LESION DESTRUCTION    . CYSTOSCOPY WITH HYDRODISTENSION AND BIOPSY  05/05/2007  . KNEE ARTHROSCOPY WITH LATERAL RELEASE Left 08/02/2016   Procedure: LEFT KNEE ARTHROSCOPY WITH LATERAL  RELEASE;  Surgeon: Sheral Apley, MD;  Location: [facility];  Service: Orthopedics;  Laterality: Left;  . SYNOVECTOMY Left 08/02/2016   Procedure: SYNOVECTOMY OF LEFT KNEE;  Surgeon: Sheral Apley, MD;  Location: [facility];  Service: Orthopedics;  Laterality: Left;    Prior to Admission medications   Medication Sig Start Date End Date Taking? Authorizing Provider  ALPRAZolam Prudy Feeler) 0.5 MG tablet Take 0.5 mg by mouth.    [provider]  aspirin EC 81 MG tablet Take 1 tablet (81 mg total) by mouth daily. Take for 14 days post op for DVT prophylaxis 08/02/16   Albina Billet III, PA-C  cephALEXin (KEFLEX) 500 MG capsule Take 1 capsule (500 mg total) by mouth 3 (three) times daily. 10/12/19   Tommi Rumps, PA-C  docusate sodium (COLACE) 100 MG capsule Take 1 capsule (100 mg total) by mouth 2 (two) times daily. To prevent constipation while taking pain medication. 08/02/16   Albina Billet III, PA-C  HYDROcodone-acetaminophen (NORCO/VICODIN) 5-325 MG tablet Take 1 tablet by mouth every 6 (six) hours as needed for moderate pain. 10/12/19   Tommi Rumps, PA-C  meloxicam (MOBIC) 15 MG tablet Take 15 mg by mouth daily.    [provider]  ondansetron (ZOFRAN ODT) 4 MG disintegrating tablet Take 1 tablet (4 mg total) by mouth every 8 (eight) hours as needed for nausea or vomiting. 10/12/19   Tommi Rumps, PA-C  phenazopyridine (PYRIDIUM) 200 MG tablet Take 1  tablet (200 mg total) by mouth 3 (three) times daily as needed for pain (urethral spasm). 10/01/19   Anyanwu, Jethro Bastos, MD  sucralfate (CARAFATE) 1 g tablet Take 1 tablet (1 g total) by mouth 4 (four) times daily -  with meals and at bedtime. 09/22/18   Lawyer, Cristal Deer, PA-C    Allergies Doxycycline  No family history on file.  Social History Social History   Tobacco Use  . Smoking status: Current Some Day Smoker    Packs/day: 0.50    Types: Cigarettes  .  Smokeless tobacco: Never Used  Substance Use Topics  . Alcohol use: Yes    Comment: social  . Drug use: No    Review of Systems Constitutional: No fever/chills.  Positive dizziness. Eyes: No visual changes. ENT: No sore throat. Cardiovascular: Denies chest pain. Respiratory: Denies shortness of breath. Gastrointestinal: Positive lower abdominal pain.  Positive nausea, positive vomiting.  No diarrhea.  No constipation. Genitourinary: Positive for dysuria.  Negative for vaginal discharge. Musculoskeletal: Negative for back pain. Skin: Negative for rash. Neurological: Negative for headaches, focal weakness or numbness. ____________________________________________   PHYSICAL EXAM:  VITAL SIGNS: ED Triage Vitals  Enc Vitals Group     BP 10/12/19 1007 109/68     Pulse Rate 10/12/19 1007 62     Resp 10/12/19 1007 18     Temp 10/12/19 1007 (!) 97.4 F (36.3 C)     Temp Source 10/12/19 1007 Oral     SpO2 10/12/19 1007 99 %     Weight 10/12/19 0934 140 lb (63.5 kg)     Height 10/12/19 0934 5\' 6"  (1.676 m)     Head Circumference --      Peak Flow --      Pain Score 10/12/19 0934 7     Pain Loc --      Pain Edu? --      Excl. in GC? --    Constitutional: Alert and oriented. Well appearing and in no acute distress. Eyes: Conjunctivae are normal.  Head: Atraumatic. Neck: No stridor.   Cardiovascular: Normal rate, regular rhythm. Grossly normal heart sounds.  Good peripheral circulation. Respiratory: Normal respiratory effort.  No retractions. Lungs CTAB. Gastrointestinal: Soft and nontender. No distention.  Bowel sounds normoactive x4 quadrants.  There is some diffuse tenderness on palpation of the left lower quadrant and suprapubic area.   Genitourinary: Unremarkable external genitalia.  No vaginal discharge is noted.  There is some minimal left adnexal tenderness but no masses noted.  Patient is negative for chandelier sign.  No masses or adnexal tenderness is noted on the  right. Musculoskeletal: Moves upper and lower extremities without any difficulty.  Patient is able to ambulate with minimal assistance. Neurologic:  Normal speech and language. No gross focal neurologic deficits are appreciated.  Skin:  Skin is warm, dry and intact. No rash noted. Psychiatric: Mood and affect are normal. Speech and behavior are normal.  ____________________________________________   LABS (all labs ordered are listed, but only abnormal results are displayed)  Labs Reviewed  COMPREHENSIVE METABOLIC PANEL - Abnormal; Notable for the following components:      Result Value   Glucose, Bld 105 (*)    Calcium 8.8 (*)    All other components within normal limits  URINALYSIS, COMPLETE (UACMP) WITH MICROSCOPIC - Abnormal; Notable for the following components:   Color, Urine YELLOW (*)    APPearance CLEAR (*)    All other components within normal limits  WET PREP, GENITAL  CHLAMYDIA/NGC RT PCR ([facility] ONLY)  SARS CORONAVIRUS 2 (TAT 6-24 HRS)  CBC    RADIOLOGY  Official radiology report(s): CT Renal Stone Study  Result Date: 10/12/2019 CLINICAL DATA:  Abdominal pain and dysuria EXAM: CT ABDOMEN AND PELVIS WITHOUT CONTRAST TECHNIQUE: Multidetector CT imaging of the abdomen and pelvis was performed following the standard protocol without IV contrast. COMPARISON:  07/21/2019 FINDINGS: Lower chest: Lung bases are free of acute infiltrate or sizable effusion. Hepatobiliary: No focal liver abnormality is seen. No gallstones, gallbladder wall thickening, or biliary dilatation. Pancreas: Unremarkable. No pancreatic ductal dilatation or surrounding inflammatory changes. Spleen: Normal in size without focal abnormality. Adrenals/Urinary Tract: Adrenal glands appear within normal limits. Kidneys are well visualized bilaterally. Small nonobstructing renal calculi are noted bilaterally similar to that noted on the prior CT examination. Ureters are well visualized without evidence of  obstructive change. No ureteral calculi are seen. The bladder is partially distended. Stomach/Bowel: No obstructive or inflammatory changes of the large or small bowel are seen. The appendix is within normal limits. The stomach is decompressed. Vascular/Lymphatic: No significant vascular findings are present. No enlarged abdominal or pelvic lymph nodes. Reproductive: Uterus and bilateral adnexa are unremarkable. Other: No abdominal wall hernia or abnormality. No abdominopelvic ascites. Musculoskeletal: No acute or significant osseous findings. IMPRESSION: Bilateral renal calculi without obstructive change. The overall appearance is similar to that seen on the prior exam. No acute abnormality noted. Electronically Signed   By: Inez Catalina M.D.   On: 10/12/2019 12:43    ____________________________________________   PROCEDURES  Procedure(s) performed (including Critical Care):  Procedures   ____________________________________________   INITIAL IMPRESSION / ASSESSMENT AND PLAN / ED COURSE  As part of my medical decision making, I reviewed the following data within the electronic MEDICAL RECORD NUMBER Notes from prior ED visits and [place] Controlled Substance Database  LINSY EHRESMAN was evaluated in Emergency Department on 10/12/2019 for the symptoms described in the history of present illness. She was evaluated in the context of the global COVID-19 pandemic, which necessitated consideration that the patient might be at risk for infection with the SARS-CoV-2 virus that causes COVID-19. Institutional protocols and algorithms that pertain to the evaluation of patients at risk for COVID-19 are in a state of rapid change based on information released by regulatory bodies including the CDC and federal and state organizations. These policies and algorithms were followed during the patient's care in the ED.  39 year old female presents to the ED with complaint of dysuria, nausea and vomiting.  Patient denies  any fever or chills.  She complained of weakness, dizziness, aching all over also with her symptoms.  Patient was seen by her PCP and started Macrodantin that was later switched to [facility] which she is started yesterday.  Patient states she has had any improvement of her symptoms with the new antibiotic but began having nausea and vomiting last evening.  Patient denies any known exposure to Covid.  Urinalysis was negative, lab work was reassuring.  Pelvic exam was unremarkable and wet prep was negative.  CT scan continues to show bilateral nephrolithiasis but no obstruction.  Patient was given IV fluids while in the ED along with Zofran and Dilaudid.  There was no noted vomiting while in the ED.  A Covid test was performed prior to discharge and patient was made aware that she could get this information from my chart should her test be positive.  Patient was discharged with a prescription for Keflex.  She is to discontinue  the Duricef as this may be a cause of her nausea and vomiting.  Zofran and Vicodin was sent to the pharmacy.  Patient will follow up with her PCP if any continued problems or concerns.  She is to return to the emergency department if any severe worsening of her symptoms. ____________________________________________   FINAL CLINICAL IMPRESSION(S) / ED DIAGNOSES  Final diagnoses:  Dysuria  Non-intractable vomiting with nausea, unspecified vomiting type     ED Discharge Orders         Ordered    ondansetron (ZOFRAN ODT) 4 MG disintegrating tablet  Every 8 hours PRN     10/12/19 1356    cephALEXin (KEFLEX) 500 MG capsule  3 times daily     10/12/19 1356    HYDROcodone-acetaminophen (NORCO/VICODIN) 5-325 MG tablet  Every 6 hours PRN     10/12/19 1356           Note:  This document was prepared using Dragon voice recognition software and may include unintentional dictation errors.    Tommi Rumps, PA-C 10/12/19 1429    Tommi Rumps, PA-C 10/12/19 1431     Jene Every, MD 10/12/19 [phone]

## 2019-10-12 NOTE — ED Notes (Signed)
Pt family at bedside  Pt has had pelvic exam completed  Tolerated fairly well   States pain is still there  Provider aware

## 2019-10-12 NOTE — ED Notes (Signed)
See triage note  Presents with abd discomfort and dizziness   States she currently being treated for UTI and was placed on new antibiotics yesterday

## 2019-11-11 ENCOUNTER — Telehealth: Payer: Self-pay | Admitting: *Deleted

## 2019-11-11 NOTE — Telephone Encounter (Signed)
Pt left voicemail on nurse line, called pt back, left message for her to call us back if she still had questions.

## 2019-11-20 DIAGNOSIS — Z03818 Encounter for observation for suspected exposure to other biological agents ruled out: Secondary | ICD-10-CM | POA: Diagnosis not present

## 2019-11-20 DIAGNOSIS — Z20828 Contact with and (suspected) exposure to other viral communicable diseases: Secondary | ICD-10-CM | POA: Diagnosis not present

## 2019-11-24 DIAGNOSIS — F988 Other specified behavioral and emotional disorders with onset usually occurring in childhood and adolescence: Secondary | ICD-10-CM | POA: Diagnosis not present

## 2019-11-24 DIAGNOSIS — F419 Anxiety disorder, unspecified: Secondary | ICD-10-CM | POA: Diagnosis not present

## 2019-11-24 DIAGNOSIS — E785 Hyperlipidemia, unspecified: Secondary | ICD-10-CM | POA: Diagnosis not present

## 2019-11-24 DIAGNOSIS — Z6823 Body mass index (BMI) 23.0-23.9, adult: Secondary | ICD-10-CM | POA: Diagnosis not present

## 2019-11-24 DIAGNOSIS — Z79899 Other long term (current) drug therapy: Secondary | ICD-10-CM | POA: Diagnosis not present

## 2019-12-03 ENCOUNTER — Ambulatory Visit (INDEPENDENT_AMBULATORY_CARE_PROVIDER_SITE_OTHER): Payer: Medicaid Other | Admitting: Urology

## 2019-12-03 ENCOUNTER — Other Ambulatory Visit: Payer: Self-pay

## 2019-12-03 ENCOUNTER — Encounter: Payer: Self-pay | Admitting: Urology

## 2019-12-03 VITALS — BP 117/75 | HR 99 | Ht 66.0 in | Wt 140.2 lb

## 2019-12-03 DIAGNOSIS — R300 Dysuria: Secondary | ICD-10-CM

## 2019-12-03 DIAGNOSIS — R3129 Other microscopic hematuria: Secondary | ICD-10-CM

## 2019-12-03 LAB — URINALYSIS, COMPLETE
Bilirubin, UA: NEGATIVE
Glucose, UA: NEGATIVE
Ketones, UA: NEGATIVE
Leukocytes,UA: NEGATIVE
Nitrite, UA: NEGATIVE
Protein,UA: NEGATIVE
Specific Gravity, UA: 1.025 (ref 1.005–1.030)
Urobilinogen, Ur: 0.2 mg/dL (ref 0.2–1.0)
pH, UA: 6 (ref 5.0–7.5)

## 2019-12-03 LAB — MICROSCOPIC EXAMINATION: Bacteria, UA: NONE SEEN

## 2019-12-03 MED ORDER — TAMSULOSIN HCL 0.4 MG PO CAPS: 0.4000 mg | ORAL_CAPSULE | Freq: Every day | ORAL | 0 refills | Status: DC

## 2019-12-03 NOTE — Progress Notes (Signed)
12/03/2019 10:05 AM   Diane Gamble Melene Plan 01-21-1981 [id number]  Referring provider: Marton Redwood [facility] [address] [place],  [place] [address]  Chief Complaint  Patient presents with  . Dysuria    HPI: 39 y.o. female presents for evaluation of urethral burning.  -I saw her early February 2021 for bilateral nephrolithiasis -History interstitial cystitis -Saw her gynecology practice 10/01/2019 with complaints of frequency urgency dysuria -Urine culture grew E. coli; initially treated with Macrodantin and switched to [facility] when not improving -CT was repeated which showed stable bilateral renal calculi and no ureteral calculi or hydronephrosis -Antibiotic was switched to cephalexin as it was suspected this may be a drug reaction -Symptoms resolved however she has had persistent burning sensation in the urethra not related to voiding -Denies fever or chills -Denies gross hematuria   PMH: Past Medical History:  Diagnosis Date  . Anxiety   . Cervical dysplasia   . Interstitial cystitis   . PONV (postoperative nausea and vomiting)     Surgical History: Past Surgical History:  Procedure Laterality Date  . CERVIX LESION DESTRUCTION    . CYSTOSCOPY WITH HYDRODISTENSION AND BIOPSY  05/05/2007  . KNEE ARTHROSCOPY WITH LATERAL RELEASE Left 08/02/2016   Procedure: LEFT KNEE ARTHROSCOPY WITH LATERAL RELEASE;  Surgeon: Sheral Apley, MD;  Location: [facility];  Service: Orthopedics;  Laterality: Left;  . SYNOVECTOMY Left 08/02/2016   Procedure: SYNOVECTOMY OF LEFT KNEE;  Surgeon: Sheral Apley, MD;  Location: [facility];  Service: Orthopedics;  Laterality: Left;    Home Medications:  Allergies as of 12/03/2019      Reactions   Doxycycline Nausea And Vomiting      Medication List       Accurate as of Dec 03, 2019 10:05 AM. If you have any questions, ask your nurse or doctor.        STOP taking these medications     cephALEXin 500 MG capsule Commonly known as: KEFLEX Stopped by: Riki Altes, MD   docusate sodium 100 MG capsule Commonly known as: Colace Stopped by: Riki Altes, MD   HYDROcodone-acetaminophen 5-325 MG tablet Commonly known as: NORCO/VICODIN Stopped by: Riki Altes, MD   meloxicam 15 MG tablet Commonly known as: MOBIC Stopped by: Riki Altes, MD   ondansetron 4 MG disintegrating tablet Commonly known as: Zofran ODT Stopped by: Riki Altes, MD   phenazopyridine 200 MG tablet Commonly known as: Pyridium Stopped by: Riki Altes, MD   sucralfate 1 g tablet Commonly known as: Carafate Stopped by: Riki Altes, MD     TAKE these medications   ALPRAZolam 0.5 MG tablet Commonly known as: XANAX Take 0.5 mg by mouth.   aspirin EC 81 MG tablet Take 1 tablet (81 mg total) by mouth daily. Take for 14 days post op for DVT prophylaxis   CRANBERRY PO Take by mouth as needed.       Allergies:  Allergies  Allergen Reactions  . Doxycycline Nausea And Vomiting    Family History: No family history on file.  Social History:  reports that she has been smoking cigarettes. She has been smoking about 0.50 packs per day. She has never used smokeless tobacco. She reports current alcohol use. She reports that she does not use drugs.   Physical Exam: BP 117/75 (BP Location: Left Arm, Patient Position: Sitting, Cuff Size: Normal)   Pulse 99   Ht 5\' 6"  (1.676 m)  Wt 140 lb 3.2 oz (63.6 kg)   BMI 22.63 kg/m   Constitutional:  Alert and oriented, No acute distress. HEENT: [place] AT, moist mucus membranes.  Trachea midline, no masses. Cardiovascular: No clubbing, cyanosis, or edema. Respiratory: Normal respiratory effort, no increased work of breathing. Skin: No rashes, bruises or suspicious lesions. Neurologic: Grossly intact, no focal deficits, moving all 4 extremities. Psychiatric: Normal mood and affect.  Laboratory Data:  Urinalysis Microscopy  0-5 WBC/3-10 RBC  Pertinent Imaging:  Results for orders placed during the hospital encounter of 10/12/19  CT Renal Stone Study   Narrative CLINICAL DATA:  Abdominal pain and dysuria  EXAM: CT ABDOMEN AND PELVIS WITHOUT CONTRAST  TECHNIQUE: Multidetector CT imaging of the abdomen and pelvis was performed following the standard protocol without IV contrast.  COMPARISON:  07/21/2019  FINDINGS: Lower chest: Lung bases are free of acute infiltrate or sizable effusion.  Hepatobiliary: No focal liver abnormality is seen. No gallstones, gallbladder wall thickening, or biliary dilatation.  Pancreas: Unremarkable. No pancreatic ductal dilatation or surrounding inflammatory changes.  Spleen: Normal in size without focal abnormality.  Adrenals/Urinary Tract: Adrenal glands appear within normal limits. Kidneys are well visualized bilaterally. Small nonobstructing renal calculi are noted bilaterally similar to that noted on the prior CT examination. Ureters are well visualized without evidence of obstructive change. No ureteral calculi are seen. The bladder is partially distended.  Stomach/Bowel: No obstructive or inflammatory changes of the large or small bowel are seen. The appendix is within normal limits. The stomach is decompressed.  Vascular/Lymphatic: No significant vascular findings are present. No enlarged abdominal or pelvic lymph nodes.  Reproductive: Uterus and bilateral adnexa are unremarkable.  Other: No abdominal wall hernia or abnormality. No abdominopelvic ascites.  Musculoskeletal: No acute or significant osseous findings.  IMPRESSION: Bilateral renal calculi without obstructive change. The overall appearance is similar to that seen on the prior exam.  No acute abnormality noted.   Electronically Signed   By: Inez Catalina M.D.   On: 10/12/2019 12:43     Assessment & Plan:    - Pelvic pain Complains of urethral burning sensation.  Urinalysis with  microhematuria.  We discussed the possibility of a small distal ureteral calculus and Rx tamsulosin was sent to pharmacy.  She also has a history of interstitial cystitis and we discussed an IC flare.  She is unable to tolerate sedating antihistamines and will start a nonsedating antihistamine.  Will call back if no improvement in the next week and we will add an anticholinergic medication if needed.  Schedule cystoscopy   Abbie Sons, MD  [facility] [address] [place], [place] [address] [phone]

## 2019-12-03 NOTE — Patient Instructions (Signed)
Cystoscopy Cystoscopy is a procedure that is used to help diagnose and sometimes treat conditions that affect the lower urinary tract. The lower urinary tract includes the bladder and the urethra. The urethra is the tube that drains urine from the bladder. Cystoscopy is done using a thin, tube-shaped instrument with a light and camera at the end (cystoscope). The cystoscope may be hard or flexible, depending on the goal of the procedure. The cystoscope is inserted through the urethra, into the bladder. Cystoscopy may be recommended if you have:  Urinary tract infections that keep coming back.  Blood in the urine (hematuria).  An inability to control when you urinate (urinary incontinence) or an overactive bladder.  Unusual cells found in a urine sample.  A blockage in the urethra, such as a urinary stone.  Painful urination.  An abnormality in the bladder found during an intravenous pyelogram (IVP) or CT scan. Cystoscopy may also be done to remove a sample of tissue to be examined under a microscope (biopsy). What are the risks? Generally, this is a safe procedure. However, problems may occur, including:  Infection.  Bleeding.  What happens during the procedure?  1. You will be given one or more of the following: ? A medicine to numb the area (local anesthetic). 2. The area around the opening of your urethra will be cleaned. 3. The cystoscope will be passed through your urethra into your bladder. 4. Germ-free (sterile) fluid will flow through the cystoscope to fill your bladder. The fluid will stretch your bladder so that your health care provider can clearly examine your bladder walls. 5. Your doctor will look at the urethra and bladder. 6. The cystoscope will be removed The procedure may vary among health care providers  What can I expect after the procedure? After the procedure, it is common to have: 1. Some soreness or pain in your abdomen and urethra. 2. Urinary symptoms.  These include: ? Mild pain or burning when you urinate. Pain should stop within a few minutes after you urinate. This may last for up to 1 week. ? A small amount of blood in your urine for several days. ? Feeling like you need to urinate but producing only a small amount of urine. Follow these instructions at home: General instructions  Return to your normal activities as told by your health care provider.   Do not drive for 24 hours if you were given a sedative during your procedure.  Watch for any blood in your urine. If the amount of blood in your urine increases, call your health care provider.  If a tissue sample was removed for testing (biopsy) during your procedure, it is up to you to get your test results. Ask your health care provider, or the department that is doing the test, when your results will be ready.  Drink enough fluid to keep your urine pale yellow.  Keep all follow-up visits as told by your health care provider. This is important. Contact a health care provider if you:  Have pain that gets worse or does not get better with medicine, especially pain when you urinate.  Have trouble urinating.  Have more blood in your urine. Get help right away if you:  Have blood clots in your urine.  Have abdominal pain.  Have a fever or chills.  Are unable to urinate. Summary  Cystoscopy is a procedure that is used to help diagnose and sometimes treat conditions that affect the lower urinary tract.  Cystoscopy is done using   a thin, tube-shaped instrument with a light and camera at the end.  After the procedure, it is common to have some soreness or pain in your abdomen and urethra.  Watch for any blood in your urine. If the amount of blood in your urine increases, call your health care provider.  If you were prescribed an antibiotic medicine, take it as told by your health care provider. Do not stop taking the antibiotic even if you start to feel better. This  information is not intended to replace advice given to you by your health care provider. Make sure you discuss any questions you have with your health care provider. Document Revised: 06/30/2018 Document Reviewed: 06/30/2018 Elsevier Patient Education  2020 Elsevier Inc[employer]   

## 2019-12-25 DIAGNOSIS — Y9344 Activity, trampolining: Secondary | ICD-10-CM | POA: Diagnosis not present

## 2019-12-25 DIAGNOSIS — R110 Nausea: Secondary | ICD-10-CM | POA: Diagnosis not present

## 2019-12-25 DIAGNOSIS — S322XXA Fracture of coccyx, initial encounter for closed fracture: Secondary | ICD-10-CM | POA: Diagnosis not present

## 2019-12-25 DIAGNOSIS — R55 Syncope and collapse: Secondary | ICD-10-CM | POA: Diagnosis not present

## 2019-12-27 DIAGNOSIS — R110 Nausea: Secondary | ICD-10-CM | POA: Diagnosis not present

## 2019-12-27 DIAGNOSIS — F419 Anxiety disorder, unspecified: Secondary | ICD-10-CM | POA: Diagnosis not present

## 2019-12-27 DIAGNOSIS — E785 Hyperlipidemia, unspecified: Secondary | ICD-10-CM | POA: Diagnosis not present

## 2019-12-27 DIAGNOSIS — Z6822 Body mass index (BMI) 22.0-22.9, adult: Secondary | ICD-10-CM | POA: Diagnosis not present

## 2019-12-27 DIAGNOSIS — F988 Other specified behavioral and emotional disorders with onset usually occurring in childhood and adolescence: Secondary | ICD-10-CM | POA: Diagnosis not present

## 2020-01-04 DIAGNOSIS — Z20822 Contact with and (suspected) exposure to covid-19: Secondary | ICD-10-CM | POA: Diagnosis not present

## 2020-01-04 DIAGNOSIS — Z03818 Encounter for observation for suspected exposure to other biological agents ruled out: Secondary | ICD-10-CM | POA: Diagnosis not present

## 2020-01-07 ENCOUNTER — Other Ambulatory Visit: Payer: Self-pay | Admitting: Urology

## 2020-01-27 ENCOUNTER — Other Ambulatory Visit: Payer: Self-pay | Admitting: Obstetrics and Gynecology

## 2020-01-27 MED ORDER — FLUCONAZOLE 150 MG PO TABS: ORAL_TABLET | ORAL | 1 refills | Status: DC

## 2020-02-09 ENCOUNTER — Ambulatory Visit: Payer: Medicaid Other | Admitting: Urology

## 2020-02-09 ENCOUNTER — Other Ambulatory Visit: Payer: Self-pay

## 2020-02-09 ENCOUNTER — Encounter: Payer: Self-pay | Admitting: Urology

## 2020-02-09 VITALS — BP 109/70 | HR 79 | Ht 66.0 in | Wt 140.0 lb

## 2020-02-09 DIAGNOSIS — R102 Pelvic and perineal pain: Secondary | ICD-10-CM

## 2020-02-09 DIAGNOSIS — N200 Calculus of kidney: Secondary | ICD-10-CM

## 2020-02-09 NOTE — Progress Notes (Signed)
   02/09/20  CC:  Chief Complaint  Patient presents with  . Cysto    HPI: Seen 12/03/2019 for urethral burning.  Symptoms have been intermittent.  Did not use tamsulosin due to intermittent symptoms.  Blood pressure 109/70, pulse 79, height 5\' 6"  (1.676 m), weight 140 lb (63.5 kg). NED. A&Ox3.   No respiratory distress   Abd soft, NT, ND Normal external genitalia with patent urethral meatus  Cystoscopy Procedure Note  Patient identification was confirmed, informed consent was obtained, and patient was prepped using Betadine solution.  Lidocaine jelly was administered per urethral meatus.    Procedure: - Flexible cystoscope introduced, without any difficulty.   - Thorough search of the bladder revealed:    normal urethral meatus    normal urothelium    no stones    no ulcers     no tumors    no urethral polyps    no trabeculation  - Ureteral orifices were normal in position and appearance.  Post-Procedure: - Patient tolerated the procedure well  Assessment/ Plan:  No abnormalities identified on cystoscopy  We discussed possible dietary triggers and she was provided literature  For Uribel to take as needed  Follow-up 6 months or earlier for worsening symptoms   , MD

## 2020-02-10 LAB — URINALYSIS, COMPLETE
Bilirubin, UA: NEGATIVE
Glucose, UA: NEGATIVE
Ketones, UA: NEGATIVE
Leukocytes,UA: NEGATIVE
Nitrite, UA: NEGATIVE
Protein,UA: NEGATIVE
RBC, UA: NEGATIVE
Specific Gravity, UA: 1.015 (ref 1.005–1.030)
Urobilinogen, Ur: 0.2 mg/dL (ref 0.2–1.0)
pH, UA: 7 (ref 5.0–7.5)

## 2020-02-10 LAB — MICROSCOPIC EXAMINATION
Bacteria, UA: NONE SEEN
RBC, Urine: NONE SEEN /hpf (ref 0–2)

## 2020-05-19 ENCOUNTER — Encounter: Payer: Self-pay | Admitting: Radiology

## 2020-06-28 DIAGNOSIS — E785 Hyperlipidemia, unspecified: Secondary | ICD-10-CM | POA: Diagnosis not present

## 2020-06-28 DIAGNOSIS — F419 Anxiety disorder, unspecified: Secondary | ICD-10-CM | POA: Diagnosis not present

## 2020-06-28 DIAGNOSIS — Z6823 Body mass index (BMI) 23.0-23.9, adult: Secondary | ICD-10-CM | POA: Diagnosis not present

## 2020-06-28 DIAGNOSIS — F988 Other specified behavioral and emotional disorders with onset usually occurring in childhood and adolescence: Secondary | ICD-10-CM | POA: Diagnosis not present

## 2020-06-28 DIAGNOSIS — Z79899 Other long term (current) drug therapy: Secondary | ICD-10-CM | POA: Diagnosis not present

## 2020-06-28 DIAGNOSIS — K219 Gastro-esophageal reflux disease without esophagitis: Secondary | ICD-10-CM | POA: Diagnosis not present

## 2020-07-04 DIAGNOSIS — R188 Other ascites: Secondary | ICD-10-CM | POA: Diagnosis not present

## 2020-07-04 DIAGNOSIS — R0902 Hypoxemia: Secondary | ICD-10-CM | POA: Diagnosis not present

## 2020-07-04 DIAGNOSIS — M79642 Pain in left hand: Secondary | ICD-10-CM | POA: Diagnosis not present

## 2020-07-04 DIAGNOSIS — R52 Pain, unspecified: Secondary | ICD-10-CM | POA: Diagnosis not present

## 2020-07-04 DIAGNOSIS — M25561 Pain in right knee: Secondary | ICD-10-CM | POA: Diagnosis not present

## 2020-07-04 DIAGNOSIS — S80919A Unspecified superficial injury of unspecified knee, initial encounter: Secondary | ICD-10-CM | POA: Diagnosis not present

## 2020-07-04 DIAGNOSIS — Z041 Encounter for examination and observation following transport accident: Secondary | ICD-10-CM | POA: Diagnosis not present

## 2020-07-31 DIAGNOSIS — E785 Hyperlipidemia, unspecified: Secondary | ICD-10-CM | POA: Diagnosis not present

## 2020-07-31 DIAGNOSIS — Z6823 Body mass index (BMI) 23.0-23.9, adult: Secondary | ICD-10-CM | POA: Diagnosis not present

## 2020-07-31 DIAGNOSIS — F988 Other specified behavioral and emotional disorders with onset usually occurring in childhood and adolescence: Secondary | ICD-10-CM | POA: Diagnosis not present

## 2020-07-31 DIAGNOSIS — R0981 Nasal congestion: Secondary | ICD-10-CM | POA: Diagnosis not present

## 2020-07-31 DIAGNOSIS — R300 Dysuria: Secondary | ICD-10-CM | POA: Diagnosis not present

## 2020-08-28 ENCOUNTER — Ambulatory Visit: Payer: Self-pay | Admitting: Urology

## 2020-09-11 ENCOUNTER — Ambulatory Visit: Payer: Self-pay | Admitting: Urology

## 2020-09-20 ENCOUNTER — Encounter: Payer: Self-pay | Admitting: Urology

## 2020-09-20 ENCOUNTER — Ambulatory Visit: Payer: Medicaid Other | Admitting: Urology

## 2020-09-28 DIAGNOSIS — R300 Dysuria: Secondary | ICD-10-CM | POA: Diagnosis not present

## 2020-09-28 DIAGNOSIS — F988 Other specified behavioral and emotional disorders with onset usually occurring in childhood and adolescence: Secondary | ICD-10-CM | POA: Diagnosis not present

## 2020-09-28 DIAGNOSIS — K219 Gastro-esophageal reflux disease without esophagitis: Secondary | ICD-10-CM | POA: Diagnosis not present

## 2020-09-28 DIAGNOSIS — Z79899 Other long term (current) drug therapy: Secondary | ICD-10-CM | POA: Diagnosis not present

## 2020-09-28 DIAGNOSIS — F419 Anxiety disorder, unspecified: Secondary | ICD-10-CM | POA: Diagnosis not present

## 2020-09-28 DIAGNOSIS — Z6823 Body mass index (BMI) 23.0-23.9, adult: Secondary | ICD-10-CM | POA: Diagnosis not present

## 2020-09-28 DIAGNOSIS — E785 Hyperlipidemia, unspecified: Secondary | ICD-10-CM | POA: Diagnosis not present

## 2020-10-02 DIAGNOSIS — J343 Hypertrophy of nasal turbinates: Secondary | ICD-10-CM | POA: Diagnosis not present

## 2020-10-02 DIAGNOSIS — J3489 Other specified disorders of nose and nasal sinuses: Secondary | ICD-10-CM | POA: Diagnosis not present

## 2020-10-02 DIAGNOSIS — R519 Headache, unspecified: Secondary | ICD-10-CM | POA: Diagnosis not present

## 2020-10-02 DIAGNOSIS — J342 Deviated nasal septum: Secondary | ICD-10-CM | POA: Diagnosis not present

## 2020-10-02 DIAGNOSIS — J351 Hypertrophy of tonsils: Secondary | ICD-10-CM | POA: Diagnosis not present

## 2020-10-02 DIAGNOSIS — J358 Other chronic diseases of tonsils and adenoids: Secondary | ICD-10-CM | POA: Diagnosis not present

## 2020-10-02 DIAGNOSIS — R0981 Nasal congestion: Secondary | ICD-10-CM | POA: Diagnosis not present

## 2020-10-30 ENCOUNTER — Other Ambulatory Visit: Payer: Self-pay | Admitting: Internal Medicine

## 2020-10-30 DIAGNOSIS — Z1231 Encounter for screening mammogram for malignant neoplasm of breast: Secondary | ICD-10-CM

## 2020-11-16 ENCOUNTER — Ambulatory Visit
Admission: RE | Admit: 2020-11-16 | Discharge: 2020-11-16 | Disposition: A | Discharge status: Home / Self Care | Payer: Medicaid Other | Source: Ambulatory Visit | Attending: Internal Medicine | Admitting: Internal Medicine

## 2020-11-16 ENCOUNTER — Other Ambulatory Visit: Payer: Self-pay

## 2020-11-16 DIAGNOSIS — Z1231 Encounter for screening mammogram for malignant neoplasm of breast: Secondary | ICD-10-CM | POA: Diagnosis not present

## 2020-12-04 DIAGNOSIS — Z0000 Encounter for general adult medical examination without abnormal findings: Secondary | ICD-10-CM | POA: Diagnosis not present

## 2020-12-06 DIAGNOSIS — Z01818 Encounter for other preprocedural examination: Secondary | ICD-10-CM | POA: Diagnosis not present

## 2020-12-06 DIAGNOSIS — E785 Hyperlipidemia, unspecified: Secondary | ICD-10-CM | POA: Diagnosis not present

## 2020-12-06 DIAGNOSIS — Z6823 Body mass index (BMI) 23.0-23.9, adult: Secondary | ICD-10-CM | POA: Diagnosis not present

## 2020-12-26 HISTORY — PX: AUGMENTATION MAMMAPLASTY: SUR837

## 2021-02-07 DIAGNOSIS — F419 Anxiety disorder, unspecified: Secondary | ICD-10-CM | POA: Diagnosis not present

## 2021-02-07 DIAGNOSIS — Z79899 Other long term (current) drug therapy: Secondary | ICD-10-CM | POA: Diagnosis not present

## 2021-02-07 DIAGNOSIS — E785 Hyperlipidemia, unspecified: Secondary | ICD-10-CM | POA: Diagnosis not present

## 2021-02-07 DIAGNOSIS — F988 Other specified behavioral and emotional disorders with onset usually occurring in childhood and adolescence: Secondary | ICD-10-CM | POA: Diagnosis not present

## 2021-02-07 DIAGNOSIS — Z6823 Body mass index (BMI) 23.0-23.9, adult: Secondary | ICD-10-CM | POA: Diagnosis not present

## 2021-03-12 DIAGNOSIS — F419 Anxiety disorder, unspecified: Secondary | ICD-10-CM | POA: Diagnosis not present

## 2021-03-12 DIAGNOSIS — Z6824 Body mass index (BMI) 24.0-24.9, adult: Secondary | ICD-10-CM | POA: Diagnosis not present

## 2021-03-12 DIAGNOSIS — F988 Other specified behavioral and emotional disorders with onset usually occurring in childhood and adolescence: Secondary | ICD-10-CM | POA: Diagnosis not present

## 2021-03-12 DIAGNOSIS — E785 Hyperlipidemia, unspecified: Secondary | ICD-10-CM | POA: Diagnosis not present

## 2021-05-13 IMAGING — MG MM DIGITAL SCREENING BILAT W/ TOMO AND CAD
8 series · 9 of 24 positions shown · non-contrast
Comparison: Previous exam(s).

CLINICAL DATA: Screening.

EXAM:
DIGITAL SCREENING BILATERAL MAMMOGRAM WITH TOMOSYNTHESIS AND CAD
TECHNIQUE: Bilateral screening digital craniocaudal and mediolateral oblique
mammograms were obtained. Bilateral screening digital breast
tomosynthesis was performed. The images were evaluated with
computer-aided detection.

[L MLO synth-2D]
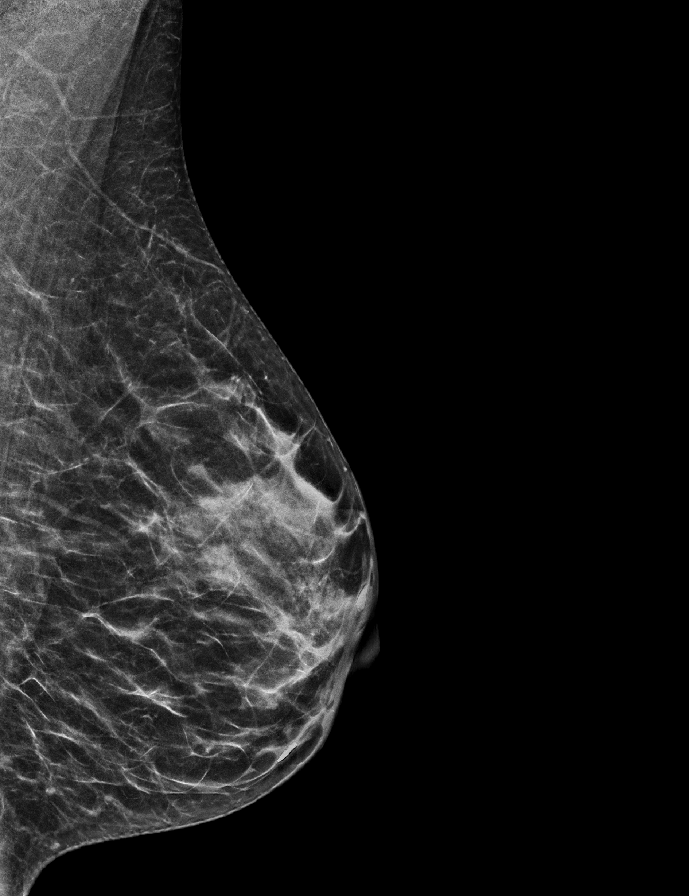

[R CC synth-2D]
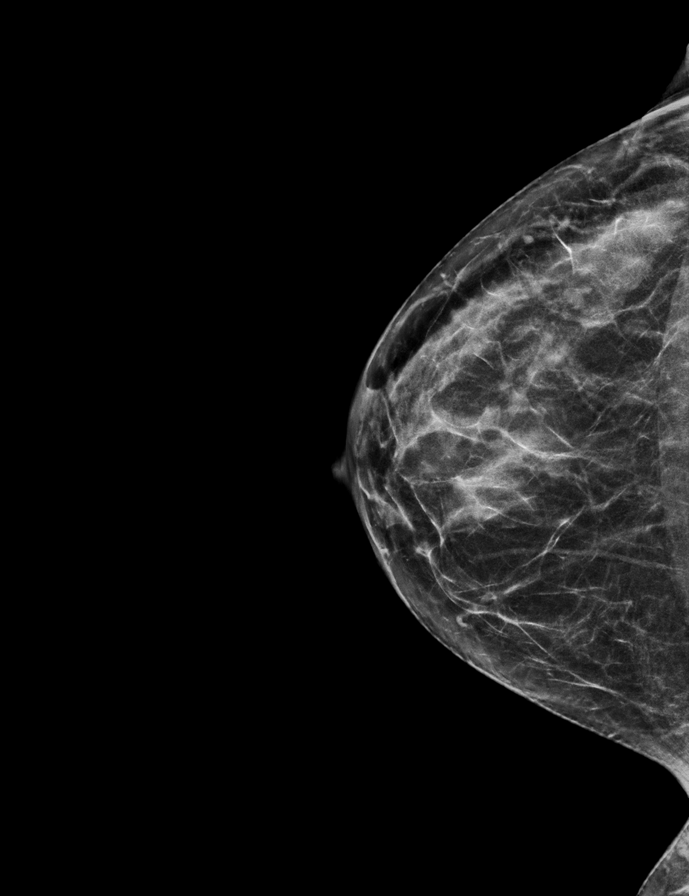

[L CC synth-2D]
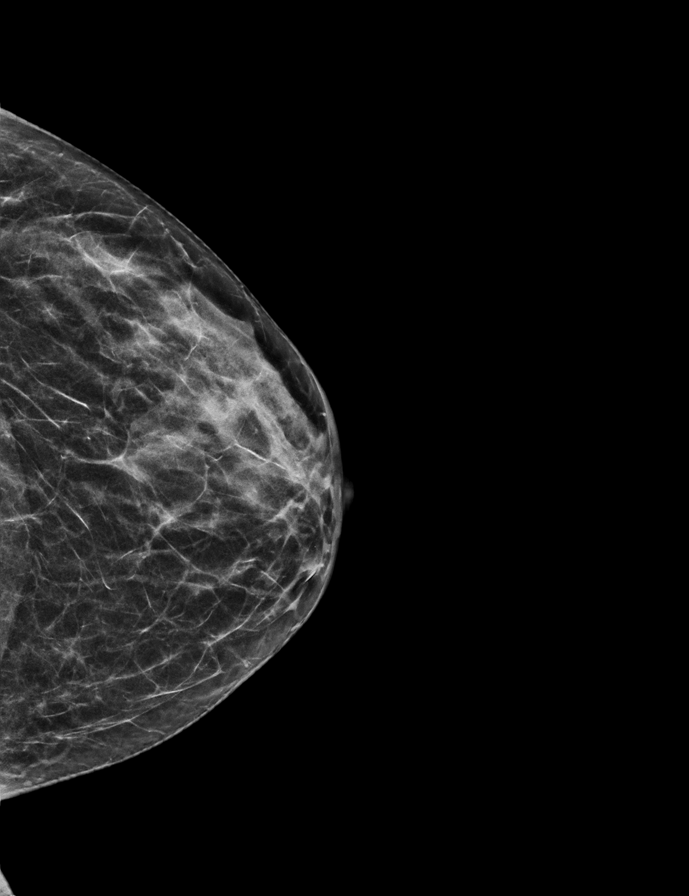

[R MLO synth-2D]
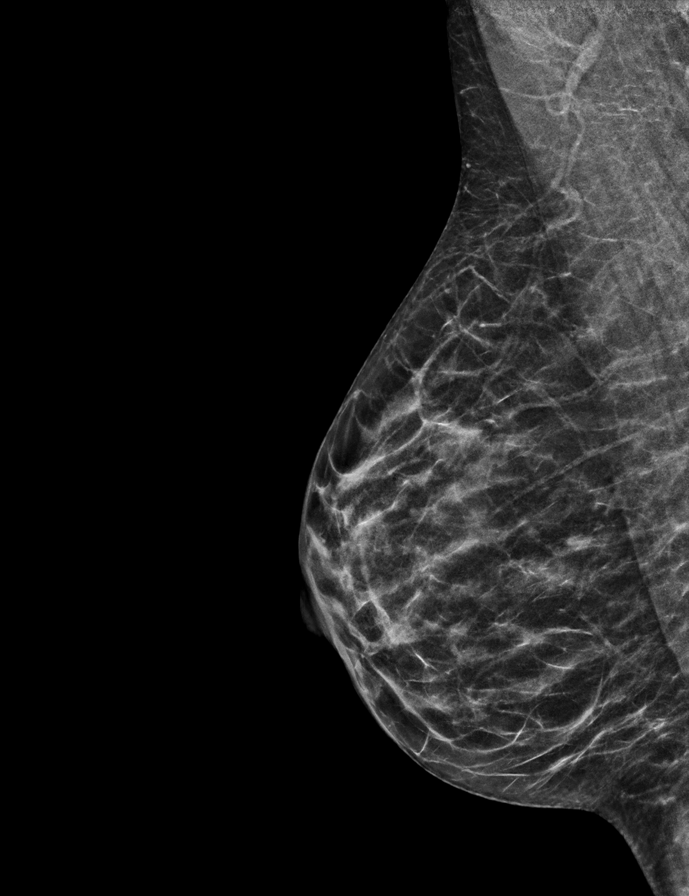

[L MLO tomo · 2 of 40 frames shown]
[frame 13/40]
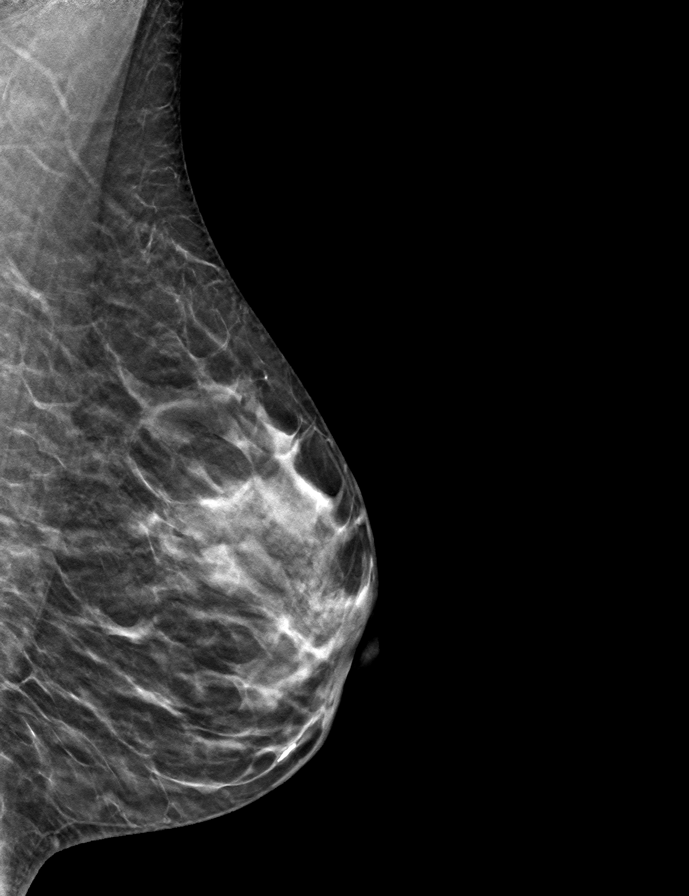
[frame 21/40]
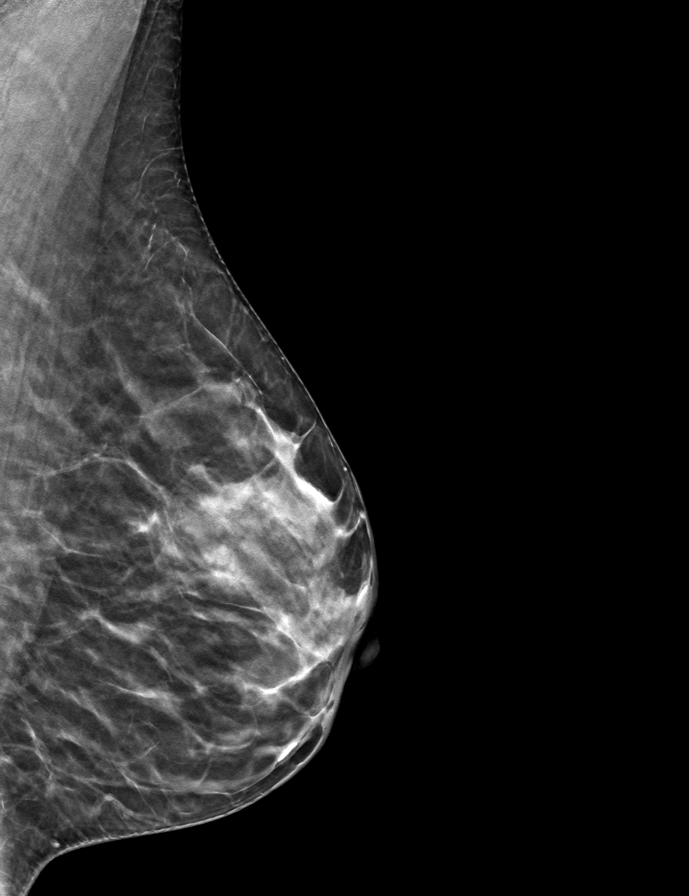

[R MLO tomo · tomo slice 22/43.0]
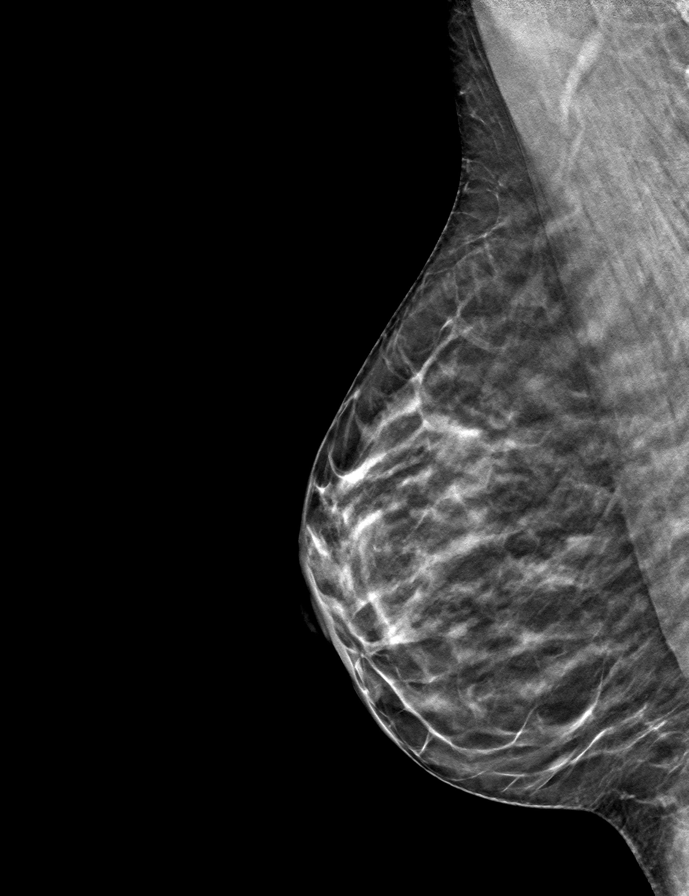

[R CC tomo · tomo slice 27/53.0]
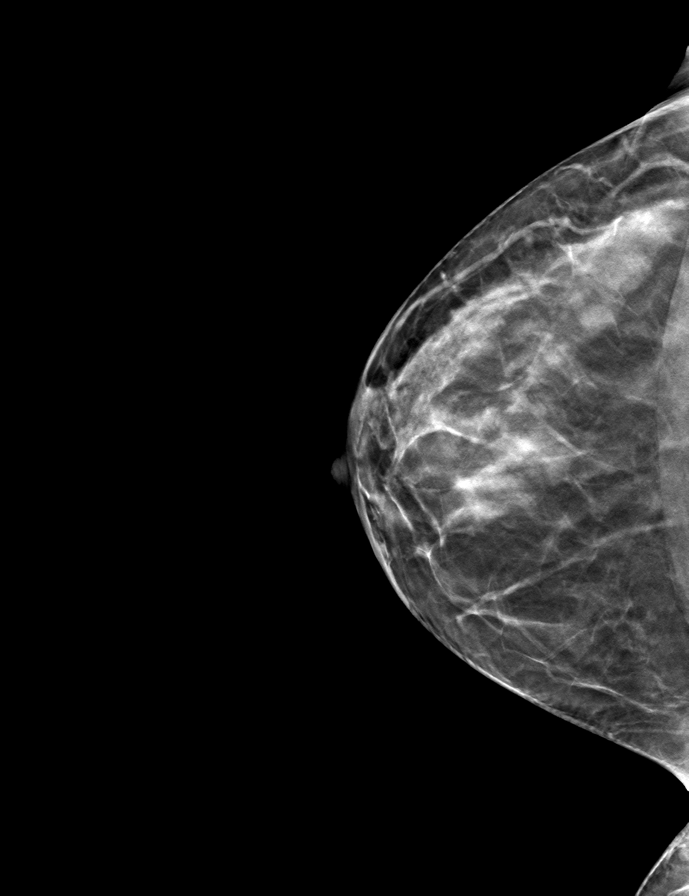

[L CC tomo · tomo slice 21/42.0]
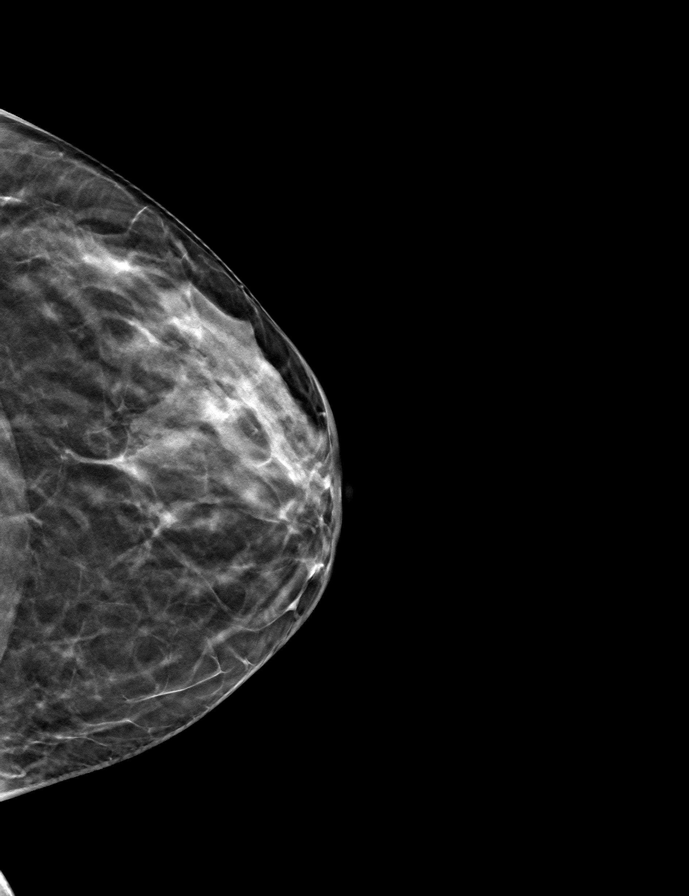

[9 of 24 positions shown; findings below may reference images not displayed]

ACR Breast Density Category c: The breast tissue is heterogeneously
dense, which may obscure small masses.
FINDINGS: There are no findings suspicious for malignancy. The images were
evaluated with computer-aided detection.
IMPRESSION: No mammographic evidence of malignancy. A result letter of this
screening mammogram will be mailed directly to the patient.

RECOMMENDATION:
Screening mammogram in one year. (Code:T4-5-GWO)

BI-RADS CATEGORY  1: Negative.

## 2021-05-14 DIAGNOSIS — F419 Anxiety disorder, unspecified: Secondary | ICD-10-CM | POA: Diagnosis not present

## 2021-05-14 DIAGNOSIS — Z79899 Other long term (current) drug therapy: Secondary | ICD-10-CM | POA: Diagnosis not present

## 2021-05-14 DIAGNOSIS — E785 Hyperlipidemia, unspecified: Secondary | ICD-10-CM | POA: Diagnosis not present

## 2021-05-14 DIAGNOSIS — F988 Other specified behavioral and emotional disorders with onset usually occurring in childhood and adolescence: Secondary | ICD-10-CM | POA: Diagnosis not present

## 2021-05-14 DIAGNOSIS — Z6824 Body mass index (BMI) 24.0-24.9, adult: Secondary | ICD-10-CM | POA: Diagnosis not present

## 2021-05-14 DIAGNOSIS — D519 Vitamin B12 deficiency anemia, unspecified: Secondary | ICD-10-CM | POA: Diagnosis not present

## 2021-06-12 ENCOUNTER — Ambulatory Visit: Payer: Medicaid Other | Admitting: Obstetrics and Gynecology

## 2021-06-18 ENCOUNTER — Ambulatory Visit
Admission: EM | Admit: 2021-06-18 | Discharge: 2021-06-18 | Disposition: A | Discharge status: Home / Self Care | Payer: Medicaid Other | Attending: Family Medicine | Admitting: Family Medicine

## 2021-06-18 ENCOUNTER — Encounter: Payer: Self-pay | Admitting: Emergency Medicine

## 2021-06-18 ENCOUNTER — Other Ambulatory Visit: Payer: Self-pay

## 2021-06-18 DIAGNOSIS — R52 Pain, unspecified: Secondary | ICD-10-CM

## 2021-06-18 DIAGNOSIS — R6883 Chills (without fever): Secondary | ICD-10-CM

## 2021-06-18 DIAGNOSIS — R051 Acute cough: Secondary | ICD-10-CM

## 2021-06-18 DIAGNOSIS — R062 Wheezing: Secondary | ICD-10-CM

## 2021-06-18 DIAGNOSIS — Z20828 Contact with and (suspected) exposure to other viral communicable diseases: Secondary | ICD-10-CM

## 2021-06-18 MED ORDER — FLUTICASONE PROPIONATE HFA 110 MCG/ACT IN AERO: 2.0000 | INHALATION_SPRAY | Freq: Two times a day (BID) | RESPIRATORY_TRACT | 0 refills | Status: DC

## 2021-06-18 MED ORDER — OSELTAMIVIR PHOSPHATE 75 MG PO CAPS: 75.0000 mg | ORAL_CAPSULE | Freq: Two times a day (BID) | ORAL | 0 refills | Status: DC

## 2021-06-18 MED ORDER — ALBUTEROL SULFATE HFA 108 (90 BASE) MCG/ACT IN AERS: 1.0000 | INHALATION_SPRAY | Freq: Four times a day (QID) | RESPIRATORY_TRACT | 0 refills | Status: AC | PRN

## 2021-06-18 MED ORDER — PROMETHAZINE-DM 6.25-15 MG/5ML PO SYRP: 5.0000 mL | ORAL_SOLUTION | Freq: Four times a day (QID) | ORAL | 0 refills | Status: DC | PRN

## 2021-06-18 NOTE — ED Triage Notes (Signed)
Pt presents with sob and chest pain. States had one episode yesterday. Woke up this am feeling okay then around 1300 developed same symptoms.

## 2021-06-20 LAB — COVID-19, FLU A+B NAA
Influenza A, NAA: NOT DETECTED
Influenza B, NAA: NOT DETECTED
SARS-CoV-2, NAA: NOT DETECTED

## 2021-06-21 ENCOUNTER — Ambulatory Visit (INDEPENDENT_AMBULATORY_CARE_PROVIDER_SITE_OTHER): Payer: Medicaid Other | Admitting: Obstetrics and Gynecology

## 2021-06-21 ENCOUNTER — Telehealth: Payer: Self-pay | Admitting: *Deleted

## 2021-06-21 ENCOUNTER — Other Ambulatory Visit: Payer: Self-pay | Admitting: Obstetrics and Gynecology

## 2021-06-21 ENCOUNTER — Encounter: Payer: Self-pay | Admitting: Obstetrics and Gynecology

## 2021-06-21 ENCOUNTER — Other Ambulatory Visit: Payer: Self-pay

## 2021-06-21 VITALS — BP 109/76 | HR 74 | Wt 147.0 lb

## 2021-06-21 DIAGNOSIS — Z30430 Encounter for insertion of intrauterine contraceptive device: Secondary | ICD-10-CM

## 2021-06-21 DIAGNOSIS — N946 Dysmenorrhea, unspecified: Secondary | ICD-10-CM | POA: Diagnosis not present

## 2021-06-21 DIAGNOSIS — Z01419 Encounter for gynecological examination (general) (routine) without abnormal findings: Secondary | ICD-10-CM | POA: Diagnosis not present

## 2021-06-21 DIAGNOSIS — N920 Excessive and frequent menstruation with regular cycle: Secondary | ICD-10-CM

## 2021-06-21 DIAGNOSIS — Z01812 Encounter for preprocedural laboratory examination: Secondary | ICD-10-CM | POA: Diagnosis not present

## 2021-06-21 HISTORY — PX: INTRAUTERINE DEVICE (IUD) INSERTION: SHX5877

## 2021-06-21 LAB — POCT URINE PREGNANCY: Preg Test, Ur: NEGATIVE

## 2021-06-21 MED ORDER — LEVONORGESTREL 20 MCG/DAY IU IUD
1.0000 | INTRAUTERINE_SYSTEM | Freq: Once | INTRAUTERINE | Status: AC
  Administered 2021-06-21: 1 via INTRAUTERINE

## 2021-06-21 MED ORDER — KETOROLAC TROMETHAMINE 10 MG PO TABS: 10.0000 mg | ORAL_TABLET | Freq: Four times a day (QID) | ORAL | 0 refills | Status: AC | PRN

## 2021-06-21 MED ORDER — FAMOTIDINE 20 MG PO TABS: 20.0000 mg | ORAL_TABLET | Freq: Two times a day (BID) | ORAL | 0 refills | Status: AC

## 2021-06-21 NOTE — Telephone Encounter (Signed)
Received a voice message from [facility] requesting a call back re: RX Dr. Vergie Living prescribed for Ketorlac 10 mg . State they need to verify if she injection first because that is what the manufacturer recommends because of increased risk of bleeding. Need a call back before they can dispense. Per chart is a patient of [facility] . Will forward to that office. Lawanda Holzheimer,RN

## 2021-06-21 NOTE — Progress Notes (Signed)
Last Pap: 06/24/2019  Last Mamm:11/16/20

## 2021-06-21 NOTE — Progress Notes (Signed)
For cramping

## 2021-06-21 NOTE — Progress Notes (Signed)
Obstetrics and Gynecology Annual Patient Evaluation  Appointment Date: 06/21/2021  OBGYN Clinic: [facility]   Primary Care Provider: [facility]  Chief Complaint: Annual exam. Worsening dysmenorrhea and menorrhagia  History of Present Illness: Diane Gamble is a 40 y.o. Caucasian G3P3(Patient's last menstrual period was 06/14/2021 (exact date).), seen for the above chief complaint. Her past medical history is significant for interstitial cystitis, chronic pelvic pain, h/o kidney stones, tobacco  Patient's period have gotten worse in terms of pain and intensity; they are still qmonth and lasts approximately five days but are particularly heavy in the first 2-3 days. LMP finished yesterday. She's used mirena x 2 in the past and ever since she came off it her periods have gotten worse. Negative u/s in 2020.   Review of Systems: Pertinent items noted in HPI and remainder of comprehensive ROS otherwise negative.    Patient Active Problem List   Diagnosis Date Noted   Dysmenorrhea 06/21/2021   Menorrhagia with regular cycle 06/21/2021   Fullness of abdomen 06/25/2019   LLQ abdominal pain 06/25/2019   PMS (premenstrual syndrome) 06/25/2019   Chronic pelvic pain in female 06/25/2019   Bilateral nephrolithiasis 06/24/2019   Vaginal discharge 06/24/2019    Past Medical History:  Past Medical History:  Diagnosis Date   Anxiety    Cervical dysplasia    Interstitial cystitis    PONV (postoperative nausea and vomiting)     Past Surgical History:  Past Surgical History:  Procedure Laterality Date   CERVIX LESION DESTRUCTION     CYSTOSCOPY WITH HYDRODISTENSION AND BIOPSY  05/05/2007   KNEE ARTHROSCOPY WITH LATERAL RELEASE Left 08/02/2016   Procedure: LEFT KNEE ARTHROSCOPY WITH LATERAL RELEASE;  Surgeon: Sheral Apley, MD;  Location: [facility];  Service: Orthopedics;  Laterality: Left;   SYNOVECTOMY Left 08/02/2016    Procedure: SYNOVECTOMY OF LEFT KNEE;  Surgeon: Sheral Apley, MD;  Location: [facility];  Service: Orthopedics;  Laterality: Left;    Past Obstetrical History:  OB History  Gravida Para Term Preterm AB Living  3         3  SAB IAB Ectopic Multiple Live Births          3    # Outcome Date GA Lbr Len/2nd Weight Sex Delivery Anes PTL Lv  3 Gravida           2 Gravida           1 Gravida             Obstetric Comments  Vag delivery x 3    Past Gynecological History: As per HPI. History of Pap Smear(s): Yes.   Last pap 06/2019, which was negative and hpv negative She is currently using vasectomy for contraception.   Social History:  Social History   Socioeconomic History   Marital status: Married    Spouse name: Not on file   Number of children: Not on file   Years of education: Not on file   Highest education level: Not on file  Occupational History   Not on file  Tobacco Use   Smoking status: Some Days    Packs/day: 0.50    Types: Cigarettes   Smokeless tobacco: Never  Vaping Use   Vaping Use: Never used  Substance and Sexual Activity   Alcohol use: Yes    Comment: social   Drug use: No   Sexual activity: Yes    Birth control/protection: None  Comment: husband had vasectomy  Other Topics Concern   Not on file  Social History Narrative   Not on file   Social Determinants of Health   Financial Resource Strain: Not on file  Food Insecurity: Not on file  Transportation Needs: Not on file  Physical Activity: Not on file  Stress: Not on file  Social Connections: Not on file  Intimate Partner Violence: Not on file    Family History: No family history on file.  Health Maintenance:  Mammogram(s): Yes.   Date: 06/2019  Medications Hildegarde Dunaway. Demps had no medications administered during this visit. Current Outpatient Medications  Medication Sig Dispense Refill   albuterol (VENTOLIN HFA) 108 (90 Base) MCG/ACT inhaler Inhale 1-2 puffs into  the lungs every 6 (six) hours as needed for wheezing or shortness of breath. 18 g 0   ALPRAZolam (XANAX) 0.5 MG tablet Take 0.5 mg by mouth.     aspirin EC 81 MG tablet Take 1 tablet (81 mg total) by mouth daily. Take for 14 days post op for DVT prophylaxis 14 tablet 0   CRANBERRY PO Take by mouth as needed.     fluconazole (DIFLUCAN) 150 MG tablet One tab po and repeat in 3 days 2 tablet 1   fluticasone (FLOVENT HFA) 110 MCG/ACT inhaler Inhale 2 puffs into the lungs 2 (two) times daily. Rinse mouth with water after each use 1 each 0   oseltamivir (TAMIFLU) 75 MG capsule Take 1 capsule (75 mg total) by mouth every 12 (twelve) hours. 10 capsule 0   promethazine-dextromethorphan (PROMETHAZINE-DM) 6.25-15 MG/5ML syrup Take 5 mLs by mouth 4 (four) times daily as needed. 100 mL 0   No current facility-administered medications for this visit.    Allergies Doxycycline   Physical Exam:  BP 109/76   Pulse 74   Wt 147 lb (66.7 kg)   LMP 06/14/2021 (Exact Date)   BMI 23.73 kg/m  Body mass index is 23.73 kg/m. General appearance: Well nourished, well developed female in no acute distress.  Neck:  Supple, normal appearance, and no thyromegaly  Cardiovascular: normal s1 and s2.  No murmurs, rubs or gallops. Respiratory:  Clear to auscultation bilateral. Normal respiratory effort Abdomen: positive bowel sounds and no masses, hernias; diffusely non tender to palpation, non distended Breasts: bilateral implants, no palpable abnormalities otherwise. Neuro/Psych:  Normal mood and affect.  Skin:  Warm and dry.  Lymphatic:  No inguinal lymphadenopathy.   Pelvic exam: is not limited by body habitus EGBUS: within normal limits Vagina: within normal limits and with no blood or discharge in the vault Cervix: normal appearing cervix without tenderness, discharge or lesions. Uterus:  nonenlarged and non tender Adnexa:  normal adnexa and no mass, fullness, tenderness Rectovaginal: deferred  See  procedure note for mirena iud insertion  Laboratory: UPT neg  Radiology: no new imaging  Assessment: pt stable  Plan:  1. Menorrhagia with regular cycle Mirena placed today. Repeat u/s if mirena doesn't help  2. Dysmenorrhea  3. Well woman gyn Mammogram utd  RTC PRN  Cornelia Copa MD Attending [facility])

## 2021-06-21 NOTE — Procedures (Signed)
Intrauterine Device (IUD) Insertion Procedure Note  After a recent pap (results: negative/date: 2020) and patient declines gonorrhea-chlamydia testing today was confirmed, written consent was obtained; her urine pregnancy test was: negative The patient understands the risks of IUD placement, which include but are not limited to: bleeding, infection, uterine perforation, risk of expulsion, risk of failure < 1%, increased risk of ectopic pregnancy in the event of failure.   Prior to the procedure being performed, the patient (or guardian) was asked to state their full name, date of birth, and the type of procedure being performed. A bimanual exam showed the uterus to be midposition.  Next, the cervix and vagina were cleaned with an antiseptic solution, and the cervix was grasped with a tenaculum.  The uterus was sounded to 7 cm.  The Mirena was placed without difficulty in the usual fashion.  The strings were cut to 3-4 cm.  The tenaculum was removed and cervix was found to be hemostatic.    No complications, patient tolerated the procedure well.  Cornelia Copa MD Attending [facility])

## 2021-06-22 ENCOUNTER — Encounter: Payer: Self-pay | Admitting: Family Medicine

## 2021-06-22 NOTE — Progress Notes (Signed)
Called by afterhours RN line due to patient's complaint  Patient had IUD inserted on 12/1 with Dr. Vergie Living. Reports continued 10 of 10 pain since insertion with reduction to 8 of 10 with Tylenol and ibuprofen. Patient reports taking meds q4 hours due to the pain.   Given greater than expected pain there is a concern for perforation. I recommended evaluation at the ER to assure IUD is in in the uterine cavity.

## 2021-06-23 NOTE — ED Provider Notes (Signed)
[facility]    CSN: [id number] Arrival date & time: 06/18/21  1929      History   Chief Complaint Chief Complaint  Patient presents with   Shortness of Breath   Chest Pain    HPI Diane Gamble is a 40 y.o. female.   Presenting today with two day history of mid chest pain, intermittent sensation of chest tightness, SOB. Episodes yesterday were short and self limiting but since this afternoon sxs have been constant. Now also having wheezing, chills, body aches, cough. States she's been exposed to influenza recently. She denies palpitations, known fever, vomiting, diarrhea, rashes, sore throat. So far not trying anything OTC. PMH significant for anxiety, asthma and allergies on occasional albuterol, antihistamines.    Past Medical History:  Diagnosis Date   Anxiety    Cervical dysplasia    Interstitial cystitis    PONV (postoperative nausea and vomiting)     Patient Active Problem List   Diagnosis Date Noted   Dysmenorrhea 06/21/2021   Menorrhagia with regular cycle 06/21/2021   Fullness of abdomen 06/25/2019   LLQ abdominal pain 06/25/2019   PMS (premenstrual syndrome) 06/25/2019   Chronic pelvic pain in female 06/25/2019   Bilateral nephrolithiasis 06/24/2019   Vaginal discharge 06/24/2019    Past Surgical History:  Procedure Laterality Date   CERVIX LESION DESTRUCTION     CYSTOSCOPY WITH HYDRODISTENSION AND BIOPSY  05/05/2007   INTRAUTERINE DEVICE (IUD) INSERTION  06/21/2021   KNEE ARTHROSCOPY WITH LATERAL RELEASE Left 08/02/2016   Procedure: LEFT KNEE ARTHROSCOPY WITH LATERAL RELEASE;  Surgeon: Sheral Apley, MD;  Location: [facility];  Service: Orthopedics;  Laterality: Left;   PLACEMENT OF BREAST IMPLANTS Bilateral    SYNOVECTOMY Left 08/02/2016   Procedure: SYNOVECTOMY OF LEFT KNEE;  Surgeon: Sheral Apley, MD;  Location: [facility];  Service: Orthopedics;  Laterality: Left;    OB History     Gravida  3    Para      Term      Preterm      AB      Living  3      SAB      IAB      Ectopic      Multiple      Live Births  3        Obstetric Comments  Vag delivery x 3          Home Medications    Prior to Admission medications   Medication Sig Start Date End Date Taking? Authorizing Provider  albuterol (VENTOLIN HFA) 108 (90 Base) MCG/ACT inhaler Inhale 1-2 puffs into the lungs every 6 (six) hours as needed for wheezing or shortness of breath. 06/18/21  Yes Particia Nearing, PA-C  fluticasone (FLOVENT HFA) 110 MCG/ACT inhaler Inhale 2 puffs into the lungs 2 (two) times daily. Rinse mouth with water after each use 06/18/21  Yes Particia Nearing, PA-C  oseltamivir (TAMIFLU) 75 MG capsule Take 1 capsule (75 mg total) by mouth every 12 (twelve) hours. 06/18/21  Yes Particia Nearing, PA-C  promethazine-dextromethorphan (PROMETHAZINE-DM) 6.25-15 MG/5ML syrup Take 5 mLs by mouth 4 (four) times daily as needed. 06/18/21  Yes Particia Nearing, PA-C  ALPRAZolam Prudy Feeler) 0.5 MG tablet Take 0.5 mg by mouth.    [provider]  aspirin EC 81 MG tablet Take 1 tablet (81 mg total) by mouth daily. Take for 14 days post op for DVT prophylaxis 08/02/16  Martensen, Lucretia Kern III, PA-C  CRANBERRY PO Take by mouth as needed.    [provider]  famotidine (PEPCID) 20 MG tablet Take 1 tablet (20 mg total) by mouth 2 (two) times daily for 5 days. 06/21/21 06/26/21  Malin Bing, MD  fluconazole (DIFLUCAN) 150 MG tablet One tab po and repeat in 3 days 01/27/20   [place] Bing, MD  ketorolac (TORADOL) 10 MG tablet Take 1 tablet (10 mg total) by mouth every 6 (six) hours as needed for up to 5 days. 06/21/21 06/26/21  [place] Bing, MD  levonorgestrel (MIRENA) 20 MCG/DAY IUD 1 each by Intrauterine route once. 06/21/21   [provider]    Family History History reviewed. No pertinent family history.  Social History Social History   Tobacco  Use   Smoking status: Some Days    Packs/day: 0.50    Types: Cigarettes   Smokeless tobacco: Never  Vaping Use   Vaping Use: Never used  Substance Use Topics   Alcohol use: Yes    Comment: social   Drug use: No     Allergies   Doxycycline   Review of Systems Review of Systems PER HPI   Physical Exam Triage Vital Signs ED Triage Vitals  Enc Vitals Group     BP 06/18/21 1955 125/73     Pulse Rate 06/18/21 1955 71     Resp 06/18/21 1955 18     Temp 06/18/21 1955 97.9 F (36.6 C)     Temp Source 06/18/21 1955 Oral     SpO2 06/18/21 1955 98 %     Weight --      Height --      Head Circumference --      Peak Flow --      Pain Score 06/18/21 1953 6     Pain Loc --      Pain Edu? --      Excl. in GC? --    No data found.  Updated Vital Signs BP 125/73 (BP Location: Right Arm)   Pulse 71   Temp 97.9 F (36.6 C) (Oral)   Resp 18   LMP 06/14/2021   SpO2 98%   Visual Acuity Right Eye Distance:   Left Eye Distance:   Bilateral Distance:    Right Eye Near:   Left Eye Near:    Bilateral Near:     Physical Exam Vitals and nursing note reviewed.  Constitutional:      Appearance: She is not ill-appearing.     Comments: Tearful, anxious  HENT:     Head: Atraumatic.     Nose: Nose normal.     Mouth/Throat:     Mouth: Mucous membranes are moist.     Pharynx: Oropharynx is clear.  Eyes:     Extraocular Movements: Extraocular movements intact.     Conjunctiva/sclera: Conjunctivae normal.  Cardiovascular:     Rate and Rhythm: Normal rate and regular rhythm.     Heart sounds: Normal heart sounds.  Pulmonary:     Effort: Pulmonary effort is normal.     Breath sounds: Normal breath sounds. No wheezing or rales.  Abdominal:     General: Bowel sounds are normal. There is no distension.     Palpations: Abdomen is soft.     Tenderness: There is no abdominal tenderness. There is no guarding.  Musculoskeletal:        General: Normal range of motion.      Cervical back: Normal range of motion  and neck supple.  Skin:    General: Skin is warm and dry.  Neurological:     Mental Status: She is alert and oriented to person, place, and time.  Psychiatric:        Thought Content: Thought content normal.        Judgment: Judgment normal.     Comments: anxious     UC Treatments / Results  Labs (all labs ordered are listed, but only abnormal results are displayed) Labs Reviewed  COVID-19, FLU A+B NAA   Narrative:    Performed at:  [facility] [address], [place], [place]  [id number] Lab Director: Jolene Schimke MD, Phone:  [phone]    EKG   Radiology No results found.  Procedures Procedures (including critical care time)  Medications Ordered in UC Medications - No data to display  Initial Impression / Assessment and Plan / UC Course  I have reviewed the triage vital signs and the nursing notes.  Pertinent labs & imaging results that were available during my care of the patient were reviewed by me and considered in my medical decision making (see chart for details).     Vital signs and exam benign and reassuring, unclear if early viral illness, asthma exacerbation, anxiety episode. COVID and flu testing pending, declines EKG and CXR with shared decision making. Will proactively treat with tamiflu, start flovent and albuterol for asthma to cover for inflammatory cough, supportive care. Did discuss at length to go to ED for cardiopulmonary evaluation if worsening.   Final Clinical Impressions(s) / UC Diagnoses   Final diagnoses:  Exposure to influenza  Acute cough  Wheezing  Chills  Body aches   Discharge Instructions   None    ED Prescriptions     Medication Sig Dispense Auth. Provider   oseltamivir (TAMIFLU) 75 MG capsule Take 1 capsule (75 mg total) by mouth every 12 (twelve) hours. 10 capsule Particia Nearing, [place]   promethazine-dextromethorphan (PROMETHAZINE-DM) 6.25-15 MG/5ML syrup Take 5  mLs by mouth 4 (four) times daily as needed. 100 mL Particia Nearing, PA-C   albuterol (VENTOLIN HFA) 108 (90 Base) MCG/ACT inhaler Inhale 1-2 puffs into the lungs every 6 (six) hours as needed for wheezing or shortness of breath. 18 g Particia Nearing, PA-C   fluticasone [facility] [place]) 110 MCG/ACT inhaler Inhale 2 puffs into the lungs 2 (two) times daily. Rinse mouth with water after each use 1 each Particia Nearing, PA-C      PDMP not reviewed this encounter.   Roosvelt Maser [place], [place] 06/23/21 [phone]

## 2021-06-25 ENCOUNTER — Emergency Department: Payer: Medicaid Other

## 2021-06-25 ENCOUNTER — Telehealth: Payer: Self-pay

## 2021-06-25 ENCOUNTER — Emergency Department
Admission: EM | Admit: 2021-06-25 | Discharge: 2021-06-25 | Disposition: A | Discharge status: Home / Self Care | Payer: Medicaid Other | Attending: Emergency Medicine | Admitting: Emergency Medicine

## 2021-06-25 ENCOUNTER — Encounter: Payer: Self-pay | Admitting: Emergency Medicine

## 2021-06-25 ENCOUNTER — Other Ambulatory Visit: Payer: Self-pay

## 2021-06-25 DIAGNOSIS — Z7982 Long term (current) use of aspirin: Secondary | ICD-10-CM | POA: Diagnosis not present

## 2021-06-25 DIAGNOSIS — R1030 Lower abdominal pain, unspecified: Secondary | ICD-10-CM

## 2021-06-25 DIAGNOSIS — N939 Abnormal uterine and vaginal bleeding, unspecified: Secondary | ICD-10-CM

## 2021-06-25 DIAGNOSIS — Y828 Other medical devices associated with adverse incidents: Secondary | ICD-10-CM | POA: Insufficient documentation

## 2021-06-25 DIAGNOSIS — T8339XA Other mechanical complication of intrauterine contraceptive device, initial encounter: Secondary | ICD-10-CM | POA: Diagnosis not present

## 2021-06-25 DIAGNOSIS — F17210 Nicotine dependence, cigarettes, uncomplicated: Secondary | ICD-10-CM | POA: Insufficient documentation

## 2021-06-25 DIAGNOSIS — R102 Pelvic and perineal pain: Secondary | ICD-10-CM

## 2021-06-25 DIAGNOSIS — N938 Other specified abnormal uterine and vaginal bleeding: Secondary | ICD-10-CM | POA: Diagnosis present

## 2021-06-25 LAB — POC URINE PREG, ED: Preg Test, Ur: NEGATIVE

## 2021-06-25 MED ORDER — METHOCARBAMOL 500 MG PO TABS: 500.0000 mg | ORAL_TABLET | Freq: Four times a day (QID) | ORAL | 0 refills | Status: DC | PRN

## 2021-06-25 NOTE — Telephone Encounter (Signed)
TC to pt to make aware I consulted w/ provider in office and pt advised to still go to ED for evaluation due to increased pain w/ IUD after insertion and no relief w/ medication.  Pt not ava LVM.

## 2021-06-25 NOTE — ED Provider Notes (Signed)
  Emergency Medicine Provider Triage Evaluation Note  Diane Gamble , a 40 y.o.female,  was evaluated in triage.  Pt complains of pelvic pain.  Patient states that she had an IUD placed 4 days prior.  Since then she has felt crampy pelvic pain along with some vaginal bleeding (1 pad per day).  She is not currently having any vaginal bleeding at this time but continues to have significant pain in the left lower quadrant.  She states that she has had IUDs placed in the past and has felt some pain but never this much.  She endorses a history of ovarian cysts in the past.  Denies back pain, flank pain, urinary symptoms, or nausea/vomiting.   Review of Systems  Positive: Left lower quadrant pain Negative: Denies fever, chest pain, vomiting  Physical Exam   Vitals:   06/25/21 1346  BP: 108/84  Pulse: 72  Resp: 20  Temp: 98 F (36.7 C)  SpO2: 99%   Gen:   Awake, no distress   Resp:  Normal effort  MSK:   Moves extremities without difficulty  Other:    Medical Decision Making  Given the patient's initial medical screening exam, the following diagnostic evaluation has been ordered. The patient will be placed in the appropriate treatment space, once one is available, to complete the evaluation and treatment. I have discussed the plan of care with the patient and I have advised the patient that an ED physician or mid-level practitioner will reevaluate their condition after the test results have been received, as the results may give them additional insight into the type of treatment they may need.    Diagnostics: Transvaginal ultrasound.  Treatments: none immediately   Varney Daily, [place] 06/25/21 1355    Jene Every, MD 06/25/21 [phone]

## 2021-06-25 NOTE — ED Notes (Signed)
Pt to ER for LLQ abdominal pain, "like an ice pick stabbing in my ovary".  EDP at bedside. U/s already done.

## 2021-06-25 NOTE — Discharge Instructions (Addendum)
Use Tylenol for pain and fevers.  Up to 1000 mg per dose, up to 4 times per day.  Do not take more than 4000 mg of Tylenol/acetaminophen within 24 hours..  Use Mobic at home. If this causes any upper abdominal pain, black and tarry stools, please discontinue.  Use Robaxin muscle relaxer 3-4 times per day.   Heating pads can be helpful too.   If you have any severely worsening abdominal pain, fever, new/worsening vaginal bleeding or discharge, please return to the ED.  Otherwise follow-up with your OB/GYN.  You are safe to travel.

## 2021-06-25 NOTE — ED Triage Notes (Signed)
Pt via POV from home. Pt c/o LLQ abd pain and vaginal bleeding since Thursday. Pt had a IUD placed on Thursday. States that since then she has had the pain. Pt also c/o bright red vaginal bleeding and denies any . Denies NVD. Denies fevers. Pt has a hx of ovarian cyst. Pt is A&OX4 and NAD

## 2021-06-25 NOTE — ED Provider Notes (Signed)
[facility] Emergency Department Provider Note ____________________________________________   Event Date/Time   First MD Initiated Contact with Patient 06/25/21 1508     (approximate)  I have reviewed the triage vital signs and the nursing notes.  HISTORY  Chief Complaint Abdominal Pain and Vaginal Bleeding   HPI Diane Gamble is a 40 y.o. femalewho presents to the ED for evaluation of vaginal bleeding and pain after recent IUD insertion.   Chart review indicates G3 P3 11/24.  Seen as an outpatient by OB/GYN 4 days ago and had a Mirena placed due to menorrhagia.  Patient presents to the ED for evaluation of lower abdominal cramping and pain, and resolving vaginal bleeding, after this Mirena was placed.  She reports having lower abdominal pain and vaginal spotting/light bleeding since immediately after it was placed on Thursday.  She reports the bleeding stopped 2 days ago, on Saturday, but she has persistent lower abdominal discomfort.  She called her OB/GYN and she was urged to go to the ED for evaluation.  Reports no other vaginal discharge.  Denies emesis, fever or severe worsening of her pain, just persistent aching pain, somewhat improved with Tylenol.  She reports hesitation with NSAIDs due to her mother having gastric ulcers and patient sometimes having upper abdominal discomfort when taking ibuprofen.  She has been tolerating p.o. intake and toileting at her baseline.  Reports explicit concern for ovarian cysts due to history of this. She reports going to [place] next week and wants to get checked out to make sure she is good to travel and relax during vacation.  Past Medical History:  Diagnosis Date   Anxiety    Cervical dysplasia    Interstitial cystitis    PONV (postoperative nausea and vomiting)     Patient Active Problem List   Diagnosis Date Noted   Dysmenorrhea 06/21/2021   Menorrhagia with regular cycle 06/21/2021   Fullness of abdomen  06/25/2019   LLQ abdominal pain 06/25/2019   PMS (premenstrual syndrome) 06/25/2019   Chronic pelvic pain in female 06/25/2019   Bilateral nephrolithiasis 06/24/2019   Vaginal discharge 06/24/2019    Past Surgical History:  Procedure Laterality Date   CERVIX LESION DESTRUCTION     CYSTOSCOPY WITH HYDRODISTENSION AND BIOPSY  05/05/2007   INTRAUTERINE DEVICE (IUD) INSERTION  06/21/2021   KNEE ARTHROSCOPY WITH LATERAL RELEASE Left 08/02/2016   Procedure: LEFT KNEE ARTHROSCOPY WITH LATERAL RELEASE;  Surgeon: Sheral Apley, MD;  Location: [facility];  Service: Orthopedics;  Laterality: Left;   PLACEMENT OF BREAST IMPLANTS Bilateral    SYNOVECTOMY Left 08/02/2016   Procedure: SYNOVECTOMY OF LEFT KNEE;  Surgeon: Sheral Apley, MD;  Location: [facility];  Service: Orthopedics;  Laterality: Left;    Prior to Admission medications   Medication Sig Start Date End Date Taking? Authorizing Provider  methocarbamol (ROBAXIN) 500 MG tablet Take 1 tablet (500 mg total) by mouth every 6 (six) hours as needed for muscle spasms. 06/25/21  Yes Delton Prairie, MD  albuterol (VENTOLIN HFA) 108 (90 Base) MCG/ACT inhaler Inhale 1-2 puffs into the lungs every 6 (six) hours as needed for wheezing or shortness of breath. 06/18/21   Particia Nearing, PA-C  ALPRAZolam Prudy Feeler) 0.5 MG tablet Take 0.5 mg by mouth.    [provider]  aspirin EC 81 MG tablet Take 1 tablet (81 mg total) by mouth daily. Take for 14 days post op for DVT prophylaxis 08/02/16   Albina Billet  III, PA-C  CRANBERRY PO Take by mouth as needed.    [provider]  famotidine (PEPCID) 20 MG tablet Take 1 tablet (20 mg total) by mouth 2 (two) times daily for 5 days. 06/21/21 06/26/21  Roselawn Bing, MD  fluconazole (DIFLUCAN) 150 MG tablet One tab po and repeat in 3 days 01/27/20    Bing, MD  fluticasone (FLOVENT HFA) 110 MCG/ACT inhaler Inhale 2 puffs into the lungs 2 (two)  times daily. Rinse mouth with water after each use 06/18/21   Particia Nearing, PA-C  ketorolac (TORADOL) 10 MG tablet Take 1 tablet (10 mg total) by mouth every 6 (six) hours as needed for up to 5 days. 06/21/21 06/26/21   Bing, MD  levonorgestrel (MIRENA) 20 MCG/DAY IUD 1 each by Intrauterine route once. 06/21/21   [provider]  oseltamivir (TAMIFLU) 75 MG capsule Take 1 capsule (75 mg total) by mouth every 12 (twelve) hours. 06/18/21   Particia Nearing, PA-C  promethazine-dextromethorphan (PROMETHAZINE-DM) 6.25-15 MG/5ML syrup Take 5 mLs by mouth 4 (four) times daily as needed. 06/18/21   Particia Nearing, PA-C    Allergies Doxycycline  History reviewed. No pertinent family history.  Social History Social History   Tobacco Use   Smoking status: Some Days    Packs/day: 0.50    Types: Cigarettes   Smokeless tobacco: Never  Vaping Use   Vaping Use: Never used  Substance Use Topics   Alcohol use: Yes    Comment: social   Drug use: No    Review of Systems  Constitutional: No fever/chills Eyes: No visual changes. ENT: No sore throat. Cardiovascular: Denies chest pain. Respiratory: Denies shortness of breath. Gastrointestinal: Positive for lower abdominal pain and resolved vaginal bleeding.  No nausea, no vomiting.  No diarrhea.  No constipation. Genitourinary: Negative for dysuria. Musculoskeletal: Negative for back pain. Skin: Negative for rash. Neurological: Negative for headaches, focal weakness or numbness.  ____________________________________________   PHYSICAL EXAM:  VITAL SIGNS: Vitals:   06/25/21 1346 06/25/21 1535  BP: 108/84 114/81  Pulse: 72 64  Resp: 20 16  Temp: 98 F (36.7 C)   SpO2: 99% 97%     Constitutional: Alert and oriented. Well appearing and in no acute distress. Eyes: Conjunctivae are normal. PERRL. EOMI. Head: Atraumatic. Nose: No congestion/rhinnorhea. Mouth/Throat: Mucous membranes are moist.   Oropharynx non-erythematous. Neck: No stridor. No cervical spine tenderness to palpation. Cardiovascular: Normal rate, regular rhythm. Grossly normal heart sounds.  Good peripheral circulation. Respiratory: Normal respiratory effort.  No retractions. Lungs CTAB. Gastrointestinal: Soft , nondistended. No CVA tenderness. Minimal diffuse lower abdominal tenderness without peritoneal features or guarding. Musculoskeletal: No lower extremity tenderness nor edema.  No joint effusions. No signs of acute trauma. Neurologic:  Normal speech and language. No gross focal neurologic deficits are appreciated. No gait instability noted. Skin:  Skin is warm, dry and intact. No rash noted. Psychiatric: Mood and affect are normal. Speech and behavior are normal.  ____________________________________________   LABS (all labs ordered are listed, but only abnormal results are displayed)  Labs Reviewed  POC URINE PREG, ED   ____________________________________________  12 Lead EKG   ____________________________________________  RADIOLOGY  ED MD interpretation:    Official radiology report(s): US PELVIC COMPLETE W TRANSVAGINAL AND TORSION R/O  Result Date: 06/25/2021 CLINICAL DATA:  Pain and bleeding since IUD placement EXAM: TRANSABDOMINAL AND TRANSVAGINAL ULTRASOUND OF PELVIS DOPPLER ULTRASOUND OF OVARIES TECHNIQUE: Both transabdominal and transvaginal ultrasound examinations of the pelvis were performed. Transabdominal  technique was performed for global imaging of the pelvis including uterus, ovaries, adnexal regions, and pelvic cul-de-sac. It was necessary to proceed with endovaginal exam following the transabdominal exam to visualize the uterus endometrium ovaries. Color and duplex Doppler ultrasound was utilized to evaluate blood flow to the ovaries. COMPARISON:  CT 10/12/2019 FINDINGS: Uterus Measurements: 8.6 x 4.1 x 4.7 cm = volume: 86 mL. No fibroids or other mass visualized. Endometrium  Thickness: 6 mm.  IUD within the uterine fundus and body Right ovary Measurements: 3.1 x 2 x 3 cm = volume: 9.8 mL. Normal appearance/no adnexal mass. Left ovary Measurements: 2.6 x 0.9 x 3 cm = volume: 3.5 mL. Normal appearance/no adnexal mass. Pulsed Doppler evaluation of both ovaries demonstrates normal low-resistance arterial and venous waveforms. Other findings No abnormal free fluid. IMPRESSION: 1. Negative for ovarian torsion or ovarian mass. 2. IUD appears grossly appropriate in position by sonography Electronically Signed   By: Jasmine Pang M.D.   On: 06/25/2021 15:29    ____________________________________________   PROCEDURES and INTERVENTIONS  Procedure(s) performed (including Critical Care):  Procedures  Medications - No data to display  ____________________________________________   MDM / ED COURSE   40 year old female presents to the ED with lower abdominal discomfort after recent IUD insertion, without evidence of acute pathology, and amenable to outpatient management.  Normal vitals on room air.  No evidence of malpositioning, uterine perforation.  No evidence of ovarian cysts or associated torsion.  TVUS is reassuring.  No barriers to outpatient management.  We discussed management at home and return precautions for the ED.     ____________________________________________   FINAL CLINICAL IMPRESSION(S) / ED DIAGNOSES  Final diagnoses:  Lower abdominal pain  Other mechanical complication of intrauterine contraceptive device, initial encounter     ED Discharge Orders          Ordered    methocarbamol (ROBAXIN) 500 MG tablet  Every 6 hours PRN        06/25/21 1542             Kele Barthelemy   Note:  This document was prepared using Conservation officer, historic buildings and may include unintentional dictation errors.    Delton Prairie, MD 06/25/21 [phone]

## 2021-07-02 ENCOUNTER — Encounter: Payer: Self-pay | Admitting: Obstetrics and Gynecology

## 2021-07-02 ENCOUNTER — Other Ambulatory Visit: Payer: Self-pay | Admitting: Obstetrics and Gynecology

## 2021-07-02 MED ORDER — TRANEXAMIC ACID 650 MG PO TABS: 1300.0000 mg | ORAL_TABLET | Freq: Three times a day (TID) | ORAL | 0 refills | Status: AC

## 2021-07-17 ENCOUNTER — Ambulatory Visit: Payer: Medicaid Other | Admitting: Obstetrics and Gynecology

## 2021-07-17 ENCOUNTER — Other Ambulatory Visit: Payer: Self-pay

## 2021-07-17 ENCOUNTER — Other Ambulatory Visit (HOSPITAL_COMMUNITY)
Admission: RE | Admit: 2021-07-17 | Discharge: 2021-07-17 | Disposition: A | Discharge status: Home / Self Care | Payer: Medicaid Other | Source: Ambulatory Visit | Attending: Obstetrics and Gynecology | Admitting: Obstetrics and Gynecology

## 2021-07-17 VITALS — BP 119/80 | HR 80 | Wt 149.0 lb

## 2021-07-17 DIAGNOSIS — Z30431 Encounter for routine checking of intrauterine contraceptive device: Secondary | ICD-10-CM

## 2021-07-17 DIAGNOSIS — N921 Excessive and frequent menstruation with irregular cycle: Secondary | ICD-10-CM

## 2021-07-17 DIAGNOSIS — Z975 Presence of (intrauterine) contraceptive device: Secondary | ICD-10-CM | POA: Diagnosis not present

## 2021-07-17 MED ORDER — NAPROXEN 500 MG PO TABS: 500.0000 mg | ORAL_TABLET | Freq: Two times a day (BID) | ORAL | 0 refills | Status: AC

## 2021-07-17 NOTE — Progress Notes (Signed)
error 

## 2021-07-17 NOTE — Progress Notes (Signed)
Obstetrics and Gynecology Visit Return Patient Evaluation  Appointment Date: 07/17/2021  Primary Care Provider: [facility]  OBGYN Clinic: [facility] for [facility]  Chief Complaint: IUD string check  History of Present Illness:  Diane Gamble is a 40 y.o. s/p 06/21/21 mirena iud insertion for heavy and painful periods. Patient had VB and pain after insertion and went to the ED on 12/5 and had a normal u/s and neg UPT. Pt states pain is gone but still having qday bleeding/spotting. Pt would like the strings cut b/c her partner can feel them  Review of Systems:  as noted in the History of Present Illness.   Patient Active Problem List   Diagnosis Date Noted   Dysmenorrhea 06/21/2021   Menorrhagia with regular cycle 06/21/2021   Fullness of abdomen 06/25/2019   LLQ abdominal pain 06/25/2019   PMS (premenstrual syndrome) 06/25/2019   Chronic pelvic pain in female 06/25/2019   Bilateral nephrolithiasis 06/24/2019   Vaginal discharge 06/24/2019   Medications:  Jolissa Kapral. Christopoulos had no medications administered during this visit. Current Outpatient Medications  Medication Sig Dispense Refill   albuterol (VENTOLIN HFA) 108 (90 Base) MCG/ACT inhaler Inhale 1-2 puffs into the lungs every 6 (six) hours as needed for wheezing or shortness of breath. 18 g 0   ALPRAZolam (XANAX) 0.5 MG tablet Take 0.5 mg by mouth.     aspirin EC 81 MG tablet Take 1 tablet (81 mg total) by mouth daily. Take for 14 days post op for DVT prophylaxis 14 tablet 0   CRANBERRY PO Take by mouth as needed.     famotidine (PEPCID) 20 MG tablet Take 1 tablet (20 mg total) by mouth 2 (two) times daily for 5 days. 10 tablet 0   fluconazole (DIFLUCAN) 150 MG tablet One tab po and repeat in 3 days 2 tablet 1   fluticasone (FLOVENT HFA) 110 MCG/ACT inhaler Inhale 2 puffs into the lungs 2 (two) times daily. Rinse mouth with water after each use 1 each 0   levonorgestrel (MIRENA) 20 MCG/DAY IUD  1 each by Intrauterine route once.     methocarbamol (ROBAXIN) 500 MG tablet Take 1 tablet (500 mg total) by mouth every 6 (six) hours as needed for muscle spasms. 20 tablet 0   oseltamivir (TAMIFLU) 75 MG capsule Take 1 capsule (75 mg total) by mouth every 12 (twelve) hours. 10 capsule 0   promethazine-dextromethorphan (PROMETHAZINE-DM) 6.25-15 MG/5ML syrup Take 5 mLs by mouth 4 (four) times daily as needed. 100 mL 0   No current facility-administered medications for this visit.    Allergies: is allergic to doxycycline.  Physical Exam:  BP 119/80    Pulse 80    Wt 149 lb (67.6 kg)    LMP  (LMP Unknown)    BMI 24.05 kg/m  Body mass index is 24.05 kg/m. General appearance: Well nourished, well developed female in no acute distress.  Abdomen: diffusely non tender to palpation, non distended, and no masses, hernias Neuro/Psych:  Normal mood and affect.    Pelvic exam:  EGBUS: normal Vaginal vault: scant old blood in the vault Cervix:  IUD strings 4cm in length>>cut to 3 Bimanual: negative   Radiology: Narrative & Impression  CLINICAL DATA:  Pain and bleeding since IUD placement   EXAM: TRANSABDOMINAL AND TRANSVAGINAL ULTRASOUND OF PELVIS   DOPPLER ULTRASOUND OF OVARIES   TECHNIQUE: Both transabdominal and transvaginal ultrasound examinations of the pelvis were performed. Transabdominal technique was performed for global imaging of the  pelvis including uterus, ovaries, adnexal regions, and pelvic cul-de-sac.   It was necessary to proceed with endovaginal exam following the transabdominal exam to visualize the uterus endometrium ovaries. Color and duplex Doppler ultrasound was utilized to evaluate blood flow to the ovaries.   COMPARISON:  CT 10/12/2019   FINDINGS: Uterus   Measurements: 8.6 x 4.1 x 4.7 cm = volume: 86 mL. No fibroids or other mass visualized.   Endometrium   Thickness: 6 mm.  IUD within the uterine fundus and body   Right ovary   Measurements:  3.1 x 2 x 3 cm = volume: 9.8 mL. Normal appearance/no adnexal mass.   Left ovary   Measurements: 2.6 x 0.9 x 3 cm = volume: 3.5 mL. Normal appearance/no adnexal mass.   Pulsed Doppler evaluation of both ovaries demonstrates normal low-resistance arterial and venous waveforms.   Other findings   No abnormal free fluid.   IMPRESSION: 1. Negative for ovarian torsion or ovarian mass. 2. IUD appears grossly appropriate in position by sonography     Electronically Signed   By: Jasmine Pang M.D.   On: 06/25/2021 15:29    Assessment: pt stable  Plan:  1. Breakthrough bleeding associated with intrauterine device (IUD) Images reviewed. I told her it can be normal to have AUB with IUD insertion and reassuring that pain is gone. F/u vaginal swabs. Pt has heard about ablation and wondering about this and I told her it's a good option and that mirena and ablation have same effectiveness rates after about a year and given her hx mirena is a great first step. Pt amenable to a naproxen course and to let us know if aub persists - Cervicovaginal ancillary only( [facility])  2. IUD check up   RTC: PRN  Cornelia Copa MD Attending Center for [facility])

## 2021-07-18 LAB — CERVICOVAGINAL ANCILLARY ONLY
Bacterial Vaginitis (gardnerella): NEGATIVE
Candida Glabrata: POSITIVE — AB
Candida Vaginitis: NEGATIVE
Chlamydia: NEGATIVE
Comment: NEGATIVE
Comment: NEGATIVE
Comment: NEGATIVE
Comment: NEGATIVE
Comment: NEGATIVE
Comment: NORMAL
Neisseria Gonorrhea: NEGATIVE
Trichomonas: NEGATIVE

## 2021-07-19 ENCOUNTER — Encounter: Payer: Self-pay | Admitting: Obstetrics and Gynecology

## 2021-07-28 ENCOUNTER — Other Ambulatory Visit: Payer: Self-pay | Admitting: Obstetrics and Gynecology

## 2021-07-28 ENCOUNTER — Telehealth: Payer: Self-pay | Admitting: Obstetrics and Gynecology

## 2021-07-28 MED ORDER — BORIC ACID VAGINAL 600 MG VA SUPP: 1.0000 | Freq: Every day | VAGINAL | 0 refills | Status: AC

## 2021-07-28 NOTE — Telephone Encounter (Signed)
GYN Telephone Note I called Diane Gamble and talked to her about her swab. I told her that the swab showed that you have a yeast infection from glabrata, which is very difficult to treat due to resistance from available treatments. The 1st line treatment is VAGINAL boric acid suppositories which have a cure rate of about 60-75%.  These would be placed in the VAGINA for 14 days. These are FATAL if taken by mouth so they must be securely locked away from any pets, children, etc who may accidentally take these by mouth.   She is still having the spotting and is amenable to treatment. This was sent in for her.  She is also having a persistent ha especially when she coughs and believes she had the flu recently (at home covid was negative). She otherwise feels fine. I told her that she may have a sinus infection but needs to have her pcp or an UC evaluate her in person in order to see  Cornelia Copa MD Attending Center for [facility] (Faculty Practice) 07/28/2021 Time: [phone]

## 2021-08-07 DIAGNOSIS — K219 Gastro-esophageal reflux disease without esophagitis: Secondary | ICD-10-CM | POA: Diagnosis not present

## 2021-08-07 DIAGNOSIS — R059 Cough, unspecified: Secondary | ICD-10-CM | POA: Diagnosis not present

## 2021-08-07 DIAGNOSIS — D519 Vitamin B12 deficiency anemia, unspecified: Secondary | ICD-10-CM | POA: Diagnosis not present

## 2021-08-07 DIAGNOSIS — F988 Other specified behavioral and emotional disorders with onset usually occurring in childhood and adolescence: Secondary | ICD-10-CM | POA: Diagnosis not present

## 2021-08-07 DIAGNOSIS — F419 Anxiety disorder, unspecified: Secondary | ICD-10-CM | POA: Diagnosis not present

## 2021-08-07 DIAGNOSIS — Z79899 Other long term (current) drug therapy: Secondary | ICD-10-CM | POA: Diagnosis not present

## 2021-08-07 DIAGNOSIS — Z6823 Body mass index (BMI) 23.0-23.9, adult: Secondary | ICD-10-CM | POA: Diagnosis not present

## 2021-08-07 DIAGNOSIS — E785 Hyperlipidemia, unspecified: Secondary | ICD-10-CM | POA: Diagnosis not present

## 2021-08-27 ENCOUNTER — Other Ambulatory Visit: Payer: Self-pay

## 2021-08-27 ENCOUNTER — Ambulatory Visit
Admission: EM | Admit: 2021-08-27 | Discharge: 2021-08-27 | Disposition: A | Discharge status: Home / Self Care | Payer: Medicaid Other | Attending: Family Medicine | Admitting: Family Medicine

## 2021-08-27 DIAGNOSIS — R051 Acute cough: Secondary | ICD-10-CM

## 2021-08-27 DIAGNOSIS — J0100 Acute maxillary sinusitis, unspecified: Secondary | ICD-10-CM | POA: Diagnosis not present

## 2021-08-27 DIAGNOSIS — H60391 Other infective otitis externa, right ear: Secondary | ICD-10-CM

## 2021-08-27 DIAGNOSIS — H6593 Unspecified nonsuppurative otitis media, bilateral: Secondary | ICD-10-CM | POA: Diagnosis not present

## 2021-08-27 MED ORDER — PROMETHAZINE-DM 6.25-15 MG/5ML PO SYRP
5.0000 mL | ORAL_SOLUTION | Freq: Four times a day (QID) | ORAL | 0 refills | Status: DC | PRN
Start: 2021-08-27 — End: 2024-06-11

## 2021-08-27 MED ORDER — FLUTICASONE PROPIONATE 50 MCG/ACT NA SUSP
1.0000 | Freq: Two times a day (BID) | NASAL | 2 refills | Status: AC
Start: 2021-08-27 — End: ?

## 2021-08-27 MED ORDER — AMOXICILLIN-POT CLAVULANATE 875-125 MG PO TABS: 1.0000 | ORAL_TABLET | Freq: Two times a day (BID) | ORAL | 0 refills | Status: DC

## 2021-08-27 MED ORDER — DEXAMETHASONE SODIUM PHOSPHATE 10 MG/ML IJ SOLN
10.0000 mg | Freq: Once | INTRAMUSCULAR | Status: AC
  Administered 2021-08-27: 10 mg via INTRAMUSCULAR

## 2021-08-27 NOTE — ED Triage Notes (Signed)
Pt reports right ear pain and yellow drainage x 2 weeks; sinus pressure and cough since 07/15/2022 after she had the flu.

## 2021-08-27 NOTE — ED Provider Notes (Signed)
[facility]    CSN: [id number] Arrival date & time: 08/27/21  1141      History   Chief Complaint Chief Complaint  Patient presents with   Otalgia    HPI Diane Gamble is a 41 y.o. female.   Presenting today with persistent sinus pressure, pain, nasal congestion, productive cough since 2 months ago after having the flu.  She also states the past 2 weeks she has been having worsening right ear pain and pressure and now yellow drainage from the ear.  Denies fever, chest pain, shortness of breath, abdominal pain, nausea vomiting or diarrhea.  Has been taking multiple over-the-counter cold and congestion medications, using eardrop solutions with no relief.   Past Medical History:  Diagnosis Date   Anxiety    Cervical dysplasia    Interstitial cystitis    PONV (postoperative nausea and vomiting)     Patient Active Problem List   Diagnosis Date Noted   Dysmenorrhea 06/21/2021   Menorrhagia with regular cycle 06/21/2021   Fullness of abdomen 06/25/2019   LLQ abdominal pain 06/25/2019   PMS (premenstrual syndrome) 06/25/2019   Chronic pelvic pain in female 06/25/2019   Bilateral nephrolithiasis 06/24/2019   Vaginal discharge 06/24/2019    Past Surgical History:  Procedure Laterality Date   CERVIX LESION DESTRUCTION     CYSTOSCOPY WITH HYDRODISTENSION AND BIOPSY  05/05/2007   INTRAUTERINE DEVICE (IUD) INSERTION  06/21/2021   KNEE ARTHROSCOPY WITH LATERAL RELEASE Left 08/02/2016   Procedure: LEFT KNEE ARTHROSCOPY WITH LATERAL RELEASE;  Surgeon: Sheral Apley, MD;  Location: [facility];  Service: Orthopedics;  Laterality: Left;   PLACEMENT OF BREAST IMPLANTS Bilateral    SYNOVECTOMY Left 08/02/2016   Procedure: SYNOVECTOMY OF LEFT KNEE;  Surgeon: Sheral Apley, MD;  Location: [facility];  Service: Orthopedics;  Laterality: Left;    OB History     Gravida  3   Para      Term      Preterm      AB      Living  3       SAB      IAB      Ectopic      Multiple      Live Births  3        Obstetric Comments  Vag delivery x 3          Home Medications    Prior to Admission medications   Medication Sig Start Date End Date Taking? Authorizing Provider  amoxicillin-clavulanate (AUGMENTIN) 875-125 MG tablet Take 1 tablet by mouth every 12 (twelve) hours. 08/27/21  Yes Particia Nearing, PA-C  fluticasone [facility]) 50 MCG/ACT nasal spray Place 1 spray into both nostrils 2 (two) times daily. 08/27/21  Yes Particia Nearing, PA-C  albuterol (VENTOLIN HFA) 108 (90 Base) MCG/ACT inhaler Inhale 1-2 puffs into the lungs every 6 (six) hours as needed for wheezing or shortness of breath. 06/18/21   Particia Nearing, PA-C  ALPRAZolam Prudy Feeler) 0.5 MG tablet Take 0.5 mg by mouth.    [provider]  aspirin EC 81 MG tablet Take 1 tablet (81 mg total) by mouth daily. Take for 14 days post op for DVT prophylaxis 08/02/16   Albina Billet III, PA-C  CRANBERRY PO Take by mouth as needed.    [provider]  famotidine (PEPCID) 20 MG tablet Take 1 tablet (20 mg total) by mouth 2 (two) times daily for 5  days. 06/21/21 06/26/21  Rock Bing, MD  fluconazole (DIFLUCAN) 150 MG tablet One tab po and repeat in 3 days 01/27/20   Plevna Bing, MD  fluticasone (FLOVENT HFA) 110 MCG/ACT inhaler Inhale 2 puffs into the lungs 2 (two) times daily. Rinse mouth with water after each use 06/18/21   Particia Nearing, PA-C  levonorgestrel (MIRENA) 20 MCG/DAY IUD 1 each by Intrauterine route once. 06/21/21   [provider]  methocarbamol (ROBAXIN) 500 MG tablet Take 1 tablet (500 mg total) by mouth every 6 (six) hours as needed for muscle spasms. 06/25/21   Delton Prairie, MD  oseltamivir (TAMIFLU) 75 MG capsule Take 1 capsule (75 mg total) by mouth every 12 (twelve) hours. 06/18/21   Particia Nearing, PA-C  promethazine-dextromethorphan (PROMETHAZINE-DM) 6.25-15 MG/5ML syrup  Take 5 mLs by mouth 4 (four) times daily as needed. 08/27/21   Particia Nearing, PA-C    Family History History reviewed. No pertinent family history.  Social History Social History   Tobacco Use   Smoking status: Some Days    Packs/day: 0.50    Types: Cigarettes   Smokeless tobacco: Never  Vaping Use   Vaping Use: Never used  Substance Use Topics   Alcohol use: Yes    Comment: social   Drug use: No     Allergies   Doxycycline   Review of Systems Review of Systems Per HPI  Physical Exam Triage Vital Signs ED Triage Vitals  Enc Vitals Group     BP 08/27/21 1351 103/67     Pulse Rate 08/27/21 1351 67     Resp 08/27/21 1351 16     Temp 08/27/21 1351 98.6 F (37 C)     Temp Source 08/27/21 1351 Oral     SpO2 08/27/21 1351 98 %     Weight --      Height --      Head Circumference --      Peak Flow --      Pain Score 08/27/21 1352 8     Pain Loc --      Pain Edu? --      Excl. in GC? --    No data found.  Updated Vital Signs BP 103/67 (BP Location: Right Arm)    Pulse 67    Temp 98.6 F (37 C) (Oral)    Resp 16    SpO2 98%   Visual Acuity Right Eye Distance:   Left Eye Distance:   Bilateral Distance:    Right Eye Near:   Left Eye Near:    Bilateral Near:     Physical Exam Vitals and nursing note reviewed.  Constitutional:      Appearance: Normal appearance.  HENT:     Head: Atraumatic.     Left Ear: External ear normal.     Ears:     Comments: Bilateral middle ear effusions.  TM injection bilaterally.  Right EAC erythematous, edematous with yellow dried drainage in the canal    Nose: Congestion present.     Mouth/Throat:     Mouth: Mucous membranes are moist.     Pharynx: Posterior oropharyngeal erythema present.  Eyes:     Extraocular Movements: Extraocular movements intact.     Conjunctiva/sclera: Conjunctivae normal.  Cardiovascular:     Rate and Rhythm: Normal rate and regular rhythm.     Heart sounds: Normal heart sounds.   Pulmonary:     Effort: Pulmonary effort is normal.     Breath sounds:  Normal breath sounds. No wheezing or rales.  Musculoskeletal:        General: Normal range of motion.     Cervical back: Normal range of motion and neck supple.  Skin:    General: Skin is warm and dry.  Neurological:     Mental Status: She is alert and oriented to person, place, and time.     Motor: No weakness.     Gait: Gait normal.  Psychiatric:        Mood and Affect: Mood normal.        Thought Content: Thought content normal.     UC Treatments / Results  Labs (all labs ordered are listed, but only abnormal results are displayed) Labs Reviewed - No data to display  EKG   Radiology No results found.  Procedures Procedures (including critical care time)  Medications Ordered in UC Medications  dexamethasone (DECADRON) injection 10 mg (10 mg Intramuscular Given 08/27/21 1421)    Initial Impression / Assessment and Plan / UC Course  I have reviewed the triage vital signs and the nursing notes.  Pertinent labs & imaging results that were available during my care of the patient were reviewed by me and considered in my medical decision making (see chart for details).     Treat with IM Decadron, Augmentin, Flonase, supportive over-the-counter medications and home care.  Return for acutely worsening symptoms.  Final Clinical Impressions(s) / UC Diagnoses   Final diagnoses:  Acute non-recurrent maxillary sinusitis  Middle ear effusion, bilateral  Infective otitis externa of right ear  Acute cough   Discharge Instructions   None    ED Prescriptions     Medication Sig Dispense Auth. Provider   amoxicillin-clavulanate (AUGMENTIN) 875-125 MG tablet Take 1 tablet by mouth every 12 (twelve) hours. 14 tablet Particia Nearing, PA-C   fluticasone [facility]) 50 MCG/ACT nasal spray Place 1 spray into both nostrils 2 (two) times daily. 16 g Roosvelt Maser [place], [place]    promethazine-dextromethorphan (PROMETHAZINE-DM) 6.25-15 MG/5ML syrup Take 5 mLs by mouth 4 (four) times daily as needed. 100 mL Particia Nearing, [place]      PDMP not reviewed this encounter.   Particia Nearing, [place] 08/27/21 1500

## 2021-10-05 DIAGNOSIS — Z79899 Other long term (current) drug therapy: Secondary | ICD-10-CM | POA: Diagnosis not present

## 2021-10-05 DIAGNOSIS — R110 Nausea: Secondary | ICD-10-CM | POA: Diagnosis not present

## 2021-10-05 DIAGNOSIS — K219 Gastro-esophageal reflux disease without esophagitis: Secondary | ICD-10-CM | POA: Diagnosis not present

## 2021-10-05 DIAGNOSIS — E785 Hyperlipidemia, unspecified: Secondary | ICD-10-CM | POA: Diagnosis not present

## 2021-10-05 DIAGNOSIS — Z6823 Body mass index (BMI) 23.0-23.9, adult: Secondary | ICD-10-CM | POA: Diagnosis not present

## 2021-10-05 DIAGNOSIS — J029 Acute pharyngitis, unspecified: Secondary | ICD-10-CM | POA: Diagnosis not present

## 2021-10-05 DIAGNOSIS — F988 Other specified behavioral and emotional disorders with onset usually occurring in childhood and adolescence: Secondary | ICD-10-CM | POA: Diagnosis not present

## 2021-10-05 DIAGNOSIS — F419 Anxiety disorder, unspecified: Secondary | ICD-10-CM | POA: Diagnosis not present

## 2021-11-30 DIAGNOSIS — Z6823 Body mass index (BMI) 23.0-23.9, adult: Secondary | ICD-10-CM | POA: Diagnosis not present

## 2021-11-30 DIAGNOSIS — E785 Hyperlipidemia, unspecified: Secondary | ICD-10-CM | POA: Diagnosis not present

## 2021-11-30 DIAGNOSIS — F988 Other specified behavioral and emotional disorders with onset usually occurring in childhood and adolescence: Secondary | ICD-10-CM | POA: Diagnosis not present

## 2021-11-30 DIAGNOSIS — F419 Anxiety disorder, unspecified: Secondary | ICD-10-CM | POA: Diagnosis not present

## 2021-12-04 ENCOUNTER — Other Ambulatory Visit: Payer: Self-pay | Admitting: Internal Medicine

## 2021-12-04 DIAGNOSIS — Z1231 Encounter for screening mammogram for malignant neoplasm of breast: Secondary | ICD-10-CM

## 2021-12-31 DIAGNOSIS — L578 Other skin changes due to chronic exposure to nonionizing radiation: Secondary | ICD-10-CM | POA: Diagnosis not present

## 2021-12-31 DIAGNOSIS — D225 Melanocytic nevi of trunk: Secondary | ICD-10-CM | POA: Diagnosis not present

## 2021-12-31 DIAGNOSIS — D485 Neoplasm of uncertain behavior of skin: Secondary | ICD-10-CM | POA: Diagnosis not present

## 2021-12-31 DIAGNOSIS — L820 Inflamed seborrheic keratosis: Secondary | ICD-10-CM | POA: Diagnosis not present

## 2022-01-04 ENCOUNTER — Other Ambulatory Visit: Payer: Self-pay | Admitting: Internal Medicine

## 2022-01-04 ENCOUNTER — Ambulatory Visit
Admission: RE | Admit: 2022-01-04 | Discharge: 2022-01-04 | Disposition: A | Discharge status: Home / Self Care | Payer: Medicaid Other | Source: Ambulatory Visit | Attending: Internal Medicine | Admitting: Internal Medicine

## 2022-01-04 DIAGNOSIS — Z1231 Encounter for screening mammogram for malignant neoplasm of breast: Secondary | ICD-10-CM | POA: Diagnosis not present

## 2022-10-07 DIAGNOSIS — F988 Other specified behavioral and emotional disorders with onset usually occurring in childhood and adolescence: Secondary | ICD-10-CM | POA: Diagnosis not present

## 2022-10-07 DIAGNOSIS — E785 Hyperlipidemia, unspecified: Secondary | ICD-10-CM | POA: Diagnosis not present

## 2022-10-07 DIAGNOSIS — Z6824 Body mass index (BMI) 24.0-24.9, adult: Secondary | ICD-10-CM | POA: Diagnosis not present

## 2022-10-07 DIAGNOSIS — F419 Anxiety disorder, unspecified: Secondary | ICD-10-CM | POA: Diagnosis not present

## 2022-12-04 DIAGNOSIS — R350 Frequency of micturition: Secondary | ICD-10-CM | POA: Diagnosis not present

## 2022-12-04 DIAGNOSIS — F419 Anxiety disorder, unspecified: Secondary | ICD-10-CM | POA: Diagnosis not present

## 2022-12-04 DIAGNOSIS — D519 Vitamin B12 deficiency anemia, unspecified: Secondary | ICD-10-CM | POA: Diagnosis not present

## 2022-12-04 DIAGNOSIS — F988 Other specified behavioral and emotional disorders with onset usually occurring in childhood and adolescence: Secondary | ICD-10-CM | POA: Diagnosis not present

## 2022-12-04 DIAGNOSIS — E785 Hyperlipidemia, unspecified: Secondary | ICD-10-CM | POA: Diagnosis not present

## 2022-12-09 DIAGNOSIS — E785 Hyperlipidemia, unspecified: Secondary | ICD-10-CM | POA: Diagnosis not present

## 2022-12-09 DIAGNOSIS — D519 Vitamin B12 deficiency anemia, unspecified: Secondary | ICD-10-CM | POA: Diagnosis not present

## 2022-12-09 DIAGNOSIS — Z79899 Other long term (current) drug therapy: Secondary | ICD-10-CM | POA: Diagnosis not present

## 2023-01-03 DIAGNOSIS — R109 Unspecified abdominal pain: Secondary | ICD-10-CM | POA: Diagnosis not present

## 2023-01-03 DIAGNOSIS — R1310 Dysphagia, unspecified: Secondary | ICD-10-CM | POA: Diagnosis not present

## 2023-01-03 DIAGNOSIS — R300 Dysuria: Secondary | ICD-10-CM | POA: Diagnosis not present

## 2023-01-03 DIAGNOSIS — K625 Hemorrhage of anus and rectum: Secondary | ICD-10-CM | POA: Diagnosis not present

## 2023-01-03 DIAGNOSIS — R319 Hematuria, unspecified: Secondary | ICD-10-CM | POA: Diagnosis not present

## 2023-01-13 ENCOUNTER — Other Ambulatory Visit: Payer: Self-pay | Admitting: Medical

## 2023-01-13 DIAGNOSIS — R319 Hematuria, unspecified: Secondary | ICD-10-CM

## 2023-01-17 ENCOUNTER — Ambulatory Visit: Payer: BC Managed Care – PPO | Admitting: Gastroenterology

## 2023-01-17 ENCOUNTER — Encounter: Payer: Self-pay | Admitting: Gastroenterology

## 2023-01-17 VITALS — BP 122/70 | HR 82 | Ht 66.0 in | Wt 145.0 lb

## 2023-01-17 DIAGNOSIS — R1310 Dysphagia, unspecified: Secondary | ICD-10-CM | POA: Diagnosis not present

## 2023-01-17 DIAGNOSIS — K219 Gastro-esophageal reflux disease without esophagitis: Secondary | ICD-10-CM

## 2023-01-17 DIAGNOSIS — R1032 Left lower quadrant pain: Secondary | ICD-10-CM

## 2023-01-17 DIAGNOSIS — K921 Melena: Secondary | ICD-10-CM

## 2023-01-17 MED ORDER — NA SULFATE-K SULFATE-MG SULF 17.5-3.13-1.6 GM/177ML PO SOLN: 1.0000 | Freq: Once | ORAL | 0 refills | Status: AC

## 2023-01-17 NOTE — Progress Notes (Signed)
HPI : Diane Gamble is a 42 y.o. female with a history of anxiety, chronic pelvic pain/interstitial cystitis who is referred to [place] [facility]for further evaluation of dysphagia. She states she has had dysphagia for 2-3 years but it is worse now. She feels like food gets stuck in her chest.  She has to bring food up about once a week.  She notes these symptoms are more noticeable with barbecue, biscuits, steak.  Sometimes she has dysphagia with liquids alone.  No weight loss She has had heartburn symptoms for about 5 years.  She estimates having symptoms 2-3 times per week on average.  She takes Nexium most days and has been doing so for about 3 years, which she says helps control her heartburn.  She also has had problems with intermittent pelvic pain for about 15 years.  This pain is episodic and can be severe.  She feels that this is ovarian in etiology, but extensive work up and evaluation with her gynceologist has not revealed any definitive etiology.  She also has a separate constant abdominal pain which different than the pelvic pain.  This pain is more constant and less severe.  She reports regular bowel movements, but her stool consistency varies from small hard stools to loose/poorly formed. She has been seeing bright red blood in the toilet water and on the toilet paper for a few months now.  She sometimes will have pain with the passage of stool, but denies severe pain akin to passing broken glass. She typically sees small amounts of blood on the toilet paper, but a few weeks ago she saw a large amount of blood in the toilet water that was concerning.  She recalls having a colonoscopy in 2009 to evaluate abdominal pain which she believes was normal.  She has had at least 2 CT scans to evaluate her chronic abdominal pain which have been unremarkable.   CT abdomen/pelvis  Dec 2021 1. No acute abdominal/pelvic injury is identified. No  intraperitoneal hemorrhage or  free air. The solid abdominal organs  are intact.  2. Small amount of free pelvic fluid is likely physiologic.  3. No acute bony findings.    CT abdomen/pelvis Mar 2020  1. No acute abdominal/pelvic injury is identified. No  intraperitoneal hemorrhage or free air. The solid abdominal organs  are intact.  2. Small amount of free pelvic fluid is likely physiologic.  3. No acute bony findings.  IMPRESSION: 1. No acute CT findings of the abdomen or pelvis to explain epigastric pain, nausea, or vomiting. 2.  Nonobstructive punctuate bilateral nephrolithiasis.   Colonoscopy 2009 Normal per patient Past Medical History:  Diagnosis Date   Anxiety    Cervical dysplasia    Interstitial cystitis    PONV (postoperative nausea and vomiting)      Past Surgical History:  Procedure Laterality Date   AUGMENTATION MAMMAPLASTY Bilateral 12/26/2020   CERVIX LESION DESTRUCTION     CYSTOSCOPY WITH HYDRODISTENSION AND BIOPSY  05/05/2007   INTRAUTERINE DEVICE (IUD) INSERTION  06/21/2021   KNEE ARTHROSCOPY WITH LATERAL RELEASE Left 08/02/2016   Procedure: LEFT KNEE ARTHROSCOPY WITH LATERAL RELEASE;  Surgeon: Sheral Apley, MD;  Location: [facility];  Service: Orthopedics;  Laterality: Left;   PLACEMENT OF BREAST IMPLANTS Bilateral    SYNOVECTOMY Left 08/02/2016   Procedure: SYNOVECTOMY OF LEFT KNEE;  Surgeon: Sheral Apley, MD;  Location: [facility];  Service: Orthopedics;  Laterality: Left;   Family History  Problem Relation Age of Onset   Breast cancer Neg Hx    Social History   Tobacco Use   Smoking status: Some Days    Packs/day: .5    Types: Cigarettes   Smokeless tobacco: Never  Vaping Use   Vaping Use: Never used  Substance Use Topics   Alcohol use: Yes    Comment: social   Drug use: No   Current Outpatient Medications  Medication Sig Dispense Refill   albuterol (VENTOLIN HFA) 108 (90 Base) MCG/ACT inhaler Inhale 1-2 puffs into the lungs  every 6 (six) hours as needed for wheezing or shortness of breath. 18 g 0   ALPRAZolam (XANAX) 0.5 MG tablet Take 0.5 mg by mouth.     amoxicillin-clavulanate (AUGMENTIN) 875-125 MG tablet Take 1 tablet by mouth every 12 (twelve) hours. 14 tablet 0   aspirin EC 81 MG tablet Take 1 tablet (81 mg total) by mouth daily. Take for 14 days post op for DVT prophylaxis 14 tablet 0   CRANBERRY PO Take by mouth as needed.     famotidine (PEPCID) 20 MG tablet Take 1 tablet (20 mg total) by mouth 2 (two) times daily for 5 days. 10 tablet 0   fluconazole (DIFLUCAN) 150 MG tablet One tab po and repeat in 3 days 2 tablet 1   fluticasone (FLONASE) 50 MCG/ACT nasal spray Place 1 spray into both nostrils 2 (two) times daily. 16 g 2   fluticasone (FLOVENT HFA) 110 MCG/ACT inhaler Inhale 2 puffs into the lungs 2 (two) times daily. Rinse mouth with water after each use 1 each 0   levonorgestrel (MIRENA) 20 MCG/DAY IUD 1 each by Intrauterine route once.     methocarbamol (ROBAXIN) 500 MG tablet Take 1 tablet (500 mg total) by mouth every 6 (six) hours as needed for muscle spasms. 20 tablet 0   oseltamivir (TAMIFLU) 75 MG capsule Take 1 capsule (75 mg total) by mouth every 12 (twelve) hours. 10 capsule 0   promethazine-dextromethorphan (PROMETHAZINE-DM) 6.25-15 MG/5ML syrup Take 5 mLs by mouth 4 (four) times daily as needed. 100 mL 0   No current facility-administered medications for this visit.   Allergies  Allergen Reactions   Doxycycline Nausea And Vomiting     Review of Systems: All systems reviewed and negative except where noted in HPI.    No results found.  Physical Exam: BP 122/70   Pulse 82   Ht 5\' 6"  (1.676 m)   Wt 145 lb (65.8 kg)   BMI 23.40 kg/m  Constitutional: Pleasant,well-developed, Caucasian female in no acute distress.  Accompanied by husband HEENT: Normocephalic and atraumatic. Conjunctivae are normal. No scleral icterus. Neck supple.  Cardiovascular: Normal rate, regular rhythm.   Pulmonary/chest: Effort normal and breath sounds normal. No wheezing, rales or rhonchi. Abdominal: Soft, nondistended, tenderness to palpation in the periumbilical region, RLQ and LLQ.  Patient states that she feels discomfort in the LLQ when her RLQ is palpated.  No rigidity or guarding. Bowel sounds active throughout. There are no masses palpable. No hepatomegaly. Extremities: no edema Neurological: Alert and oriented to person place and time. Skin: Skin is warm and dry. No rashes noted. Psychiatric: Normal mood and affect. Behavior is normal.  CBC    Component Value Date/Time   WBC 6.6 10/12/2019 0937   RBC 4.31 10/12/2019 0937   HGB 13.8 10/12/2019 0937   HCT 40.5 10/12/2019 0937   PLT 273 10/12/2019 0937   MCV 94.0 10/12/2019 0937   MCH 32.0 10/12/2019 0937  MCHC 34.1 10/12/2019 0937   RDW 12.6 10/12/2019 0937   LYMPHSABS 2.8 09/22/2018 0535   MONOABS 1.2 (H) 09/22/2018 0535   EOSABS 0.4 09/22/2018 0535   BASOSABS 0.1 09/22/2018 0535    CMP     Component Value Date/Time   NA 139 10/12/2019 0937   K 3.9 10/12/2019 0937   CL 108 10/12/2019 0937   CO2 24 10/12/2019 0937   GLUCOSE 105 (H) 10/12/2019 0937   BUN 11 10/12/2019 0937   CREATININE 0.56 10/12/2019 0937   CALCIUM 8.8 (L) 10/12/2019 0937   PROT 6.9 10/12/2019 0937   ALBUMIN 4.1 10/12/2019 0937   AST 15 10/12/2019 0937   ALT 11 10/12/2019 0937   ALKPHOS 51 10/12/2019 0937   BILITOT 0.7 10/12/2019 0937   GFRNONAA >60 10/12/2019 0937   GFRAA >60 10/12/2019 0937       Latest Ref Rng & Units 10/12/2019    9:37 AM 09/22/2018    5:35 AM 03/31/2010   11:49 AM  CBC EXTENDED  WBC 4.0 - 10.5 K/uL 6.6  11.8    RBC 3.87 - 5.11 MIL/uL 4.31  4.57    Hemoglobin 12.0 - 15.0 g/dL 81.1  91.4  78.2   HCT 36.0 - 46.0 % 40.5  43.1  49.0   Platelets 150 - 400 K/uL 273  291    NEUT# 1.7 - 7.7 K/uL  7.3    Lymph# 0.7 - 4.0 K/uL  2.8        ASSESSMENT AND PLAN: 42 year old female with several years of recurrent  heartburn, and newer onset of dysphagia without anemia or weight loss.  Suspect she has a peptic stricture or Schatzki ring.  Will schedule for EGD with dilation. She also has a history of recurrent scant hematochezia, but had a more recent episode of more higher volume hematochezia.  Although this is most likely hemorrhoids, a colonoscopy is indicated to definitively exclude other etiologies such as mass lesion or proctitis. She has chronic abdominal pain, which seems to be most likely pelvic in etiology, as opposed to gastrointestinal.  She has a diagnosis of interstitial cystitis, which would be the most likely cause of her pelvic pain.  We will evaluate for other potential causes of abdominal pain such as H. pylori, celiac and Crohn's disease during her colonoscopy.  We can discuss treatment of chronic visceral pain further after her endoscopic procedures and consider initiation of TCA therapy.  We discussed the anatomy and physiology of hemorrhoids, as well as the principles of hemorrhoid management to include optimization of bowel habits, with a goal of passing a soft formed stool daily without straining, avoidance of hard stools and straining, limiting time on the toilet to less than 5 minutes.  We discussed the role of fiber in optimizing the stool bulk and consistency.  We also discussed the role of hemorrhoid banding for persistent symptoms.   Dysphagia, suspect ring/stricture - EGD with dilation  GERD - Continue Nexium daily for now  Abdominal pain - EGD/Colonoscopy with biopsies to r/o celiac/H. pylori - Will follow-up with patient after endoscopies to discuss further management of chronic abdominal pain  Hematochezia, likely hemorrhoidal - Colonoscopy - Daily metamucil  The details, risks (including bleeding, perforation, infection, missed lesions, medication reactions and possible hospitalization or surgery if complications occur), benefits, and alternatives to EGD/colonoscopy with  possible biopsy and possible polypectomy were discussed with the patient and she consents to proceed.   Shamell Suarez E. Tomasa Rand, MD [facility]

## 2023-01-17 NOTE — Patient Instructions (Signed)
_______________________________________________________  If your blood pressure at your visit was 140/90 or greater, please contact your primary care physician to follow up on this.  _______________________________________________________  If you are age 44 or older, your body mass index should be between 23-30. Your Body mass index is 23.4 kg/m. If this is out of the aforementioned range listed, please consider follow up with your Primary Care Provider.  If you are age 40 or younger, your body mass index should be between 19-25. Your Body mass index is 23.4 kg/m. If this is out of the aformentioned range listed, please consider follow up with your Primary Care Provider.   You have been scheduled for an endoscopy and colonoscopy. Please follow the written instructions given to you at your visit today. Please pick up your prep supplies at the pharmacy within the next 1-3 days. If you use inhalers (even only as needed), please bring them with you on the day of your procedure.   ________________________________________________________  The [facility] providers would like to encourage you to use [facility] to communicate with providers for non-urgent requests or questions.  Due to long hold times on the telephone, sending your provider a message by [facility] may be a faster and more efficient way to get a response.  Please allow 48 business hours for a response.  Please remember that this is for non-urgent requests.   It was a pleasure to see you today!  Thank you for trusting me with your gastrointestinal care!    Scott E.Tomasa Rand, MD

## 2023-01-22 ENCOUNTER — Ambulatory Visit
Admission: RE | Admit: 2023-01-22 | Discharge: 2023-01-22 | Disposition: A | Discharge status: Home / Self Care | Payer: BC Managed Care – PPO | Source: Ambulatory Visit | Attending: Medical | Admitting: Medical

## 2023-01-22 DIAGNOSIS — R109 Unspecified abdominal pain: Secondary | ICD-10-CM | POA: Diagnosis not present

## 2023-01-22 DIAGNOSIS — R319 Hematuria, unspecified: Secondary | ICD-10-CM

## 2023-01-22 DIAGNOSIS — N200 Calculus of kidney: Secondary | ICD-10-CM | POA: Diagnosis not present

## 2023-02-03 DIAGNOSIS — Z6823 Body mass index (BMI) 23.0-23.9, adult: Secondary | ICD-10-CM | POA: Diagnosis not present

## 2023-02-03 DIAGNOSIS — K219 Gastro-esophageal reflux disease without esophagitis: Secondary | ICD-10-CM | POA: Diagnosis not present

## 2023-02-03 DIAGNOSIS — F988 Other specified behavioral and emotional disorders with onset usually occurring in childhood and adolescence: Secondary | ICD-10-CM | POA: Diagnosis not present

## 2023-02-03 DIAGNOSIS — F419 Anxiety disorder, unspecified: Secondary | ICD-10-CM | POA: Diagnosis not present

## 2023-02-28 ENCOUNTER — Encounter: Payer: Self-pay | Admitting: Gastroenterology

## 2023-03-12 ENCOUNTER — Ambulatory Visit: Payer: BC Managed Care – PPO | Admitting: Gastroenterology

## 2023-03-12 ENCOUNTER — Encounter: Payer: Self-pay | Admitting: Gastroenterology

## 2023-03-12 VITALS — BP 112/70 | HR 71 | Temp 97.8°F | Resp 14 | Ht 66.0 in | Wt 145.0 lb

## 2023-03-12 DIAGNOSIS — K219 Gastro-esophageal reflux disease without esophagitis: Secondary | ICD-10-CM

## 2023-03-12 DIAGNOSIS — K222 Esophageal obstruction: Secondary | ICD-10-CM | POA: Diagnosis not present

## 2023-03-12 DIAGNOSIS — R1310 Dysphagia, unspecified: Secondary | ICD-10-CM

## 2023-03-12 DIAGNOSIS — K2951 Unspecified chronic gastritis with bleeding: Secondary | ICD-10-CM | POA: Diagnosis not present

## 2023-03-12 DIAGNOSIS — K5732 Diverticulitis of large intestine without perforation or abscess without bleeding: Secondary | ICD-10-CM

## 2023-03-12 DIAGNOSIS — K921 Melena: Secondary | ICD-10-CM | POA: Diagnosis not present

## 2023-03-12 MED ORDER — SODIUM CHLORIDE 0.9 % IV SOLN: 500.0000 mL | Freq: Once | INTRAVENOUS | Status: DC

## 2023-03-12 NOTE — Progress Notes (Signed)
Sedate, gd SR, tolerated procedure well, VSS, report to RN 

## 2023-03-12 NOTE — Progress Notes (Signed)
Called to room to assist during endoscopic procedure.  Patient ID and intended procedure confirmed with present staff. Received instructions for my participation in the procedure from the performing physician.  

## 2023-03-12 NOTE — Op Note (Signed)
[facility] Patient Name: Diane Gamble Procedure Date: 03/12/2023 1:30 PM MRN: [id number] Endoscopist: Lorin Picket E. Tomasa Rand , MD, [id number] Age: 42 Referring MD:  Date of Birth: [birth year] Gender: Female Account #: [id number] Procedure:                Colonoscopy Indications:              Generalized abdominal pain, Hematochezia Medicines:                Monitored Anesthesia Care Procedure:                Pre-Anesthesia Assessment:                           - Prior to the procedure, a History and Physical                            was performed, and patient medications and                            allergies were reviewed. The patient's tolerance of                            previous anesthesia was also reviewed. The risks                            and benefits of the procedure and the sedation                            options and risks were discussed with the patient.                            All questions were answered, and informed consent                            was obtained. Prior Anticoagulants: The patient has                            taken no anticoagulant or antiplatelet agents. ASA                            Grade Assessment: II - A patient with mild systemic                            disease. After reviewing the risks and benefits,                            the patient was deemed in satisfactory condition to                            undergo the procedure.                           After obtaining informed consent, the colonoscope  was passed under direct vision. Throughout the                            procedure, the patient's blood pressure, pulse, and                            oxygen saturations were monitored continuously. The                            CF [id number] #[id number] was introduced through the anus                            and advanced to the the terminal ileum, with                            identification of  the appendiceal orifice and IC                            valve. The colonoscopy was performed without                            difficulty. The patient tolerated the procedure                            well. The quality of the bowel preparation was                            good. The terminal ileum, ileocecal valve,                            appendiceal orifice, and rectum were photographed.                            The bowel preparation used was SUPREP via split                            dose instruction. Scope In: 1:58:25 PM Scope Out: 2:11:06 PM Scope Withdrawal Time: 0 hours 8 minutes 34 seconds  Total Procedure Duration: 0 hours 12 minutes 41 seconds  Findings:                 The perianal and digital rectal examinations were                            normal. Pertinent negatives include normal                            sphincter tone and no palpable rectal lesions.                           Many small-mouthed diverticula were found in the                            sigmoid colon and descending colon.  The exam was otherwise normal throughout the                            examined colon.                           The terminal ileum appeared normal.                           Non-bleeding internal hemorrhoids were found during                            retroflexion. The hemorrhoids were Grade I                            (internal hemorrhoids that do not prolapse).                           No additional abnormalities were found on                            retroflexion. Complications:            No immediate complications. Estimated Blood Loss:     Estimated blood loss: none. Impression:               - Mild diverticulosis in the sigmoid colon and in                            the descending colon.                           - The examined portion of the ileum was normal.                           - Non-bleeding internal hemorrhoids. This is the                             source of the patient's hematochezia                           - No specimens collected.                           - No abnormalities to explain abdominal pain. Recommendation:           - Patient has a contact number available for                            emergencies. The signs and symptoms of potential                            delayed complications were discussed with the                            patient. Return to normal activities tomorrow.  Written discharge instructions were provided to the                            patient.                           - Resume previous diet.                           - Continue present medications.                           - Repeat colonoscopy in 10 years for screening                            purposes.                           - Recommend daily fiber supplementation to reduce                            risk of hemorrhoidal bleeding and complications of                            diverticulosis.                           - Follow up as needed in the office for ongoing                            management of chronic GI symptoms. Aireana Ryland E. Tomasa Rand, MD 03/12/2023 2:24:56 PM This report has been signed electronically.

## 2023-03-12 NOTE — Patient Instructions (Addendum)
Continue your protonix as ordered.  Resume all of your previous medications today as ordered.  Read all of the handouts given to you by your recovery room nurse.  Abide by your special diet for today.  Take benefiber daily per Dr Tomasa Rand.  YOU HAD AN ENDOSCOPIC PROCEDURE TODAY AT THE  ENDOSCOPY CENTER:   Refer to the procedure report that was given to you for any specific questions about what was found during the examination.  If the procedure report does not answer your questions, please call your gastroenterologist to clarify.  If you requested that your care partner not be given the details of your procedure findings, then the procedure report has been included in a sealed envelope for you to review at your convenience later.  YOU SHOULD EXPECT: Some feelings of bloating in the abdomen. Passage of more gas than usual.  Walking can help get rid of the air that was put into your GI tract during the procedure and reduce the bloating. If you had a lower endoscopy (such as a colonoscopy or flexible sigmoidoscopy) you may notice spotting of blood in your stool or on the toilet paper. If you underwent a bowel prep for your procedure, you may not have a normal bowel movement for a few days.  Please Note:  You might notice some irritation and congestion in your nose or some drainage.  This is from the oxygen used during your procedure.  There is no need for concern and it should clear up in a day or so.  SYMPTOMS TO REPORT IMMEDIATELY:  Following lower endoscopy (colonoscopy or flexible sigmoidoscopy):  Excessive amounts of blood in the stool  Significant tenderness or worsening of abdominal pains  Swelling of the abdomen that is new, acute  Fever of 100F or higher  Following upper endoscopy (EGD)  Vomiting of blood or coffee ground material  New chest pain or pain under the shoulder blades  Painful or persistently difficult swallowing  New shortness of breath  Fever of 100F or  higher  Black, tarry-looking stools  For urgent or emergent issues, a gastroenterologist can be reached at any hour by calling [phone]. Do not use MyChart messaging for urgent concerns.    DIET:  We do recommend nothing by mouth until 3:15 pm.  From 3:15 pm until 4:15 pm, you may have clear liquids.  You can have a soft diet for the rest of today. Then,  you may proceed to your regular diet tomorrow. Drink plenty of fluids but you should avoid alcoholic beverages for 24 hours at least.   ACTIVITY:  You should plan to take it easy for the rest of today and you should NOT DRIVE or use heavy machinery until tomorrow (because of the sedation medicines used during the test).    FOLLOW UP: Our staff will call the number listed on your records the next business day following your procedure.  We will call around 7:15- 8:00 am to check on you and address any questions or concerns that you may have regarding the information given to you following your procedure. If we do not reach you, we will leave a message.     If any biopsies were taken you will be contacted by phone or by letter within the next 1-3 weeks.  Please call us at [phone] if you have not heard about the biopsies in 3 weeks.    SIGNATURES/CONFIDENTIALITY: You and/or your care partner have signed paperwork which will be entered into your  electronic medical record.  These signatures attest to the fact that that the information above on your After Visit Summary has been reviewed and is understood.  Full responsibility of the confidentiality of this discharge information lies with you and/or your care-partner.

## 2023-03-12 NOTE — Progress Notes (Signed)
[facility] History and Physical   Primary Care Physician:  [facility]   Reason for Procedure:   Dysphagia, abdominal pain, hematochezia  Plan:    EGD, colonoscopy     HPI: Diane Gamble is a 42 y.o. female undergoing EGD and colonoscopy to evaluate symptoms of chronic abdominal pain and GERD symptoms with recent onset dysphagia and recurrent hematochezia. Dysphagia has recently improved with Protonix.    Past Medical History:  Diagnosis Date   Anxiety    Cervical dysplasia    Interstitial cystitis    PONV (postoperative nausea and vomiting)     Past Surgical History:  Procedure Laterality Date   AUGMENTATION MAMMAPLASTY Bilateral 12/26/2020   CERVIX LESION DESTRUCTION     CYSTOSCOPY WITH HYDRODISTENSION AND BIOPSY  05/05/2007   INTRAUTERINE DEVICE (IUD) INSERTION  06/21/2021   KNEE ARTHROSCOPY WITH LATERAL RELEASE Left 08/02/2016   Procedure: LEFT KNEE ARTHROSCOPY WITH LATERAL RELEASE;  Surgeon: Sheral Apley, MD;  Location: [facility];  Service: Orthopedics;  Laterality: Left;   PLACEMENT OF BREAST IMPLANTS Bilateral    SYNOVECTOMY Left 08/02/2016   Procedure: SYNOVECTOMY OF LEFT KNEE;  Surgeon: Sheral Apley, MD;  Location: [facility];  Service: Orthopedics;  Laterality: Left;    Prior to Admission medications   Medication Sig Start Date End Date Taking? Authorizing Provider  ALPRAZolam Prudy Feeler) 0.5 MG tablet Take 0.5 mg by mouth.   Yes [provider]  amphetamine-dextroamphetamine (ADDERALL) 15 MG tablet Take 1 tablet by mouth 2 (two) times daily. 03/10/23  Yes [provider]  levonorgestrel (MIRENA) 20 MCG/DAY IUD 1 each by Intrauterine route once. 06/21/21  Yes [provider]  albuterol (VENTOLIN HFA) 108 (90 Base) MCG/ACT inhaler Inhale 1-2 puffs into the lungs every 6 (six) hours as needed for wheezing or shortness of breath. Patient not taking: Reported on 01/17/2023  06/18/21   Particia Nearing, PA-C  CRANBERRY PO Take by mouth as needed.    [provider]  famotidine (PEPCID) 20 MG tablet Take 1 tablet (20 mg total) by mouth 2 (two) times daily for 5 days. 06/21/21 06/26/21   Bing, MD  fluticasone (FLONASE) 50 MCG/ACT nasal spray Place 1 spray into both nostrils 2 (two) times daily. 08/27/21   Particia Nearing, PA-C  fluticasone (FLOVENT HFA) 110 MCG/ACT inhaler Inhale 2 puffs into the lungs 2 (two) times daily. Rinse mouth with water after each use 06/18/21   Particia Nearing, PA-C  methocarbamol (ROBAXIN) 500 MG tablet Take 1 tablet (500 mg total) by mouth every 6 (six) hours as needed for muscle spasms. Patient not taking: Reported on 01/17/2023 06/25/21   Delton Prairie, MD  promethazine-dextromethorphan (PROMETHAZINE-DM) 6.25-15 MG/5ML syrup Take 5 mLs by mouth 4 (four) times daily as needed. 08/27/21   Particia Nearing, PA-C    Current Outpatient Medications  Medication Sig Dispense Refill   ALPRAZolam (XANAX) 0.5 MG tablet Take 0.5 mg by mouth.     amphetamine-dextroamphetamine (ADDERALL) 15 MG tablet Take 1 tablet by mouth 2 (two) times daily.     levonorgestrel (MIRENA) 20 MCG/DAY IUD 1 each by Intrauterine route once.     albuterol (VENTOLIN HFA) 108 (90 Base) MCG/ACT inhaler Inhale 1-2 puffs into the lungs every 6 (six) hours as needed for wheezing or shortness of breath. (Patient not taking: Reported on 01/17/2023) 18 g 0   CRANBERRY PO Take by mouth as needed.     famotidine (PEPCID) 20 MG tablet  Take 1 tablet (20 mg total) by mouth 2 (two) times daily for 5 days. 10 tablet 0   fluticasone (FLONASE) 50 MCG/ACT nasal spray Place 1 spray into both nostrils 2 (two) times daily. 16 g 2   fluticasone (FLOVENT HFA) 110 MCG/ACT inhaler Inhale 2 puffs into the lungs 2 (two) times daily. Rinse mouth with water after each use 1 each 0   methocarbamol (ROBAXIN) 500 MG tablet Take 1 tablet (500 mg total) by mouth every 6  (six) hours as needed for muscle spasms. (Patient not taking: Reported on 01/17/2023) 20 tablet 0   promethazine-dextromethorphan (PROMETHAZINE-DM) 6.25-15 MG/5ML syrup Take 5 mLs by mouth 4 (four) times daily as needed. 100 mL 0   Current Facility-Administered Medications  Medication Dose Route Frequency Provider Last Rate Last Admin   0.9 %  sodium chloride infusion  500 mL Intravenous Once Jenel Lucks, MD        Allergies as of 03/12/2023 - Review Complete 03/12/2023  Allergen Reaction Noted   Doxycycline Nausea And Vomiting 10/12/2019    Family History  Problem Relation Age of Onset   Diabetes Maternal Grandmother    Breast cancer Neg Hx    Colon cancer Neg Hx    Esophageal cancer Neg Hx    Stomach cancer Neg Hx     Social History   Socioeconomic History   Marital status: Married    Spouse name: Not on file   Number of children: 3   Years of education: Not on file   Highest education level: Not on file  Occupational History   Occupation: [employer]  Tobacco Use   Smoking status: Some Days    Current packs/day: 0.50    Types: Cigarettes   Smokeless tobacco: Never  Vaping Use   Vaping status: Never Used  Substance and Sexual Activity   Alcohol use: Yes    Comment: social   Drug use: No   Sexual activity: Yes    Birth control/protection: None    Comment: husband had vasectomy  Other Topics Concern   Not on file  Social History Narrative   Not on file   Social Determinants of Health   Financial Resource Strain: Not on file  Food Insecurity: Not on file  Transportation Needs: Not on file  Physical Activity: Not on file  Stress: Not on file  Social Connections: Not on file  Intimate Partner Violence: Not on file    Review of Systems:  All other review of systems negative except as mentioned in the HPI.  Physical Exam: Vital signs BP 121/79   Pulse 72   Temp 97.8 F (36.6 C)   Ht 5\' 6"  (1.676 m)   Wt 145 lb (65.8 kg)   LMP 02/26/2023  (Approximate)   SpO2 99%   BMI 23.40 kg/m   General:   Alert,  Well-developed, well-nourished, pleasant and cooperative in NAD Airway:  Mallampati 2 Lungs:  Clear throughout to auscultation.   Heart:  Regular rate and rhythm; no murmurs, clicks, rubs,  or gallops. Abdomen:  Soft, nontender and nondistended. Normal bowel sounds.   Neuro/Psych:  Normal mood and affect. A and O x 3   Deolinda Frid E. Tomasa Rand, MD [facility]

## 2023-03-12 NOTE — Op Note (Signed)
[facility] Patient Name: Diane Gamble Procedure Date: 03/12/2023 1:31 PM MRN: [id number] Endoscopist: Lorin Picket E. Tomasa Rand , MD, [id number] Age: 42 Referring MD:  Date of Birth: 03/07/1981 Gender: Female Account #: [id number] Procedure:                Upper GI endoscopy Indications:              Dysphagia, abdominal pain Medicines:                Monitored Anesthesia Care Procedure:                Pre-Anesthesia Assessment:                           - Prior to the procedure, a History and Physical                            was performed, and patient medications and                            allergies were reviewed. The patient's tolerance of                            previous anesthesia was also reviewed. The risks                            and benefits of the procedure and the sedation                            options and risks were discussed with the patient.                            All questions were answered, and informed consent                            was obtained. Prior Anticoagulants: The patient has                            taken no anticoagulant or antiplatelet agents. ASA                            Grade Assessment: II - A patient with mild systemic                            disease. After reviewing the risks and benefits,                            the patient was deemed in satisfactory condition to                            undergo the procedure.                           After obtaining informed consent, the endoscope was  passed under direct vision. Throughout the                            procedure, the patient's blood pressure, pulse, and                            oxygen saturations were monitored continuously. The                            Olympus Scope [id number] was introduced through the                            mouth, and advanced to the second part of duodenum.                            The upper GI  endoscopy was accomplished without                            difficulty. The patient tolerated the procedure                            well. Scope In: Scope Out: Findings:                 The examined portions of the nasopharynx,                            oropharynx and larynx were normal.                           One benign-appearing, intrinsic mild stenosis was                            found in the distal esophagus. This stenosis                            measured 1.2 cm (inner diameter) x less than one cm                            (in length). The stenosis was traversed. A                            guidewire was placed and the scope was withdrawn.                            Dilation was performed with a Savary dilator with                            no resistance at 16 mm. The dilation site was                            examined following endoscope reinsertion and showed  mild mucosal disruption. Estimated blood loss was                            minimal.                           The exam of the esophagus was otherwise normal.                           The entire examined stomach was normal. Biopsies                            were taken with a cold forceps for Helicobacter                            pylori testing. Estimated blood loss was minimal.                           The examined duodenum was normal. Complications:            No immediate complications. Estimated Blood Loss:     Estimated blood loss was minimal. Impression:               - The examined portions of the nasopharynx,                            oropharynx and larynx were normal.                           - Benign-appearing esophageal stenosis. Dilated.                            This is the likely source of dysphagia, and is most                            likely related to acid reflux                           - Normal stomach. Biopsied.                           - Normal  examined duodenum. Recommendation:           - Patient has a contact number available for                            emergencies. The signs and symptoms of potential                            delayed complications were discussed with the                            patient. Return to normal activities tomorrow.                            Written discharge instructions were provided to the  patient.                           - Resume previous diet.                           - Continue present medications.                           - Await pathology results.                           - Continue Protonix indefinitely Diane Gamble E. Tomasa Rand, MD 03/12/2023 2:19:39 PM This report has been signed electronically.

## 2023-03-13 ENCOUNTER — Telehealth: Payer: Self-pay | Admitting: *Deleted

## 2023-03-13 NOTE — Telephone Encounter (Signed)
  Follow up Call-     03/12/2023   12:54 PM  Call back number  Post procedure Call Back phone  # [phone]  Permission to leave phone message Yes     Patient questions:  Do you have a fever, pain , or abdominal swelling? No. Pain Score  0 *  Have you tolerated food without any problems? Yes.    Have you been able to return to your normal activities? Yes.    Do you have any questions about your discharge instructions: Diet   No. Medications  No. Follow up visit  No.  Do you have questions or concerns about your Care? No.  Actions: * If pain score is 4 or above: No action needed, pain <4.

## 2023-03-18 NOTE — Progress Notes (Signed)
Diane Gamble,  The biopsies taken from your stomach were notable for mild chronic gastritis (inflammation) which is a common finding and often related to medications, tobacco or alcohol, but there was no evidence of Helicobacter pylori infection. This common finding is not likely to explain abdominal pain and there is no specific treatment or further evaluation recommended.  Please follow up as needed in the office for further management of your chronic symptoms.

## 2023-03-26 ENCOUNTER — Inpatient Hospital Stay
Admission: EM | Admit: 2023-03-26 | Discharge: 2023-03-30 | DRG: 372 | Disposition: A | Discharge status: Home / Self Care | Payer: BC Managed Care – PPO | Attending: Internal Medicine | Admitting: Internal Medicine

## 2023-03-26 ENCOUNTER — Emergency Department: Payer: BC Managed Care – PPO

## 2023-03-26 ENCOUNTER — Other Ambulatory Visit: Payer: Self-pay

## 2023-03-26 DIAGNOSIS — B9689 Other specified bacterial agents as the cause of diseases classified elsewhere: Secondary | ICD-10-CM | POA: Diagnosis present

## 2023-03-26 DIAGNOSIS — K529 Noninfective gastroenteritis and colitis, unspecified: Principal | ICD-10-CM | POA: Diagnosis present

## 2023-03-26 DIAGNOSIS — K219 Gastro-esophageal reflux disease without esophagitis: Secondary | ICD-10-CM | POA: Diagnosis present

## 2023-03-26 DIAGNOSIS — R519 Headache, unspecified: Secondary | ICD-10-CM | POA: Diagnosis not present

## 2023-03-26 DIAGNOSIS — K5100 Ulcerative (chronic) pancolitis without complications: Secondary | ICD-10-CM | POA: Diagnosis not present

## 2023-03-26 DIAGNOSIS — Z87891 Personal history of nicotine dependence: Secondary | ICD-10-CM

## 2023-03-26 DIAGNOSIS — Z881 Allergy status to other antibiotic agents status: Secondary | ICD-10-CM | POA: Diagnosis not present

## 2023-03-26 DIAGNOSIS — Z833 Family history of diabetes mellitus: Secondary | ICD-10-CM | POA: Diagnosis not present

## 2023-03-26 DIAGNOSIS — K7689 Other specified diseases of liver: Secondary | ICD-10-CM | POA: Diagnosis not present

## 2023-03-26 DIAGNOSIS — E871 Hypo-osmolality and hyponatremia: Secondary | ICD-10-CM | POA: Diagnosis present

## 2023-03-26 DIAGNOSIS — A040 Enteropathogenic Escherichia coli infection: Principal | ICD-10-CM | POA: Diagnosis present

## 2023-03-26 DIAGNOSIS — E860 Dehydration: Secondary | ICD-10-CM | POA: Diagnosis present

## 2023-03-26 DIAGNOSIS — R9431 Abnormal electrocardiogram [ECG] [EKG]: Secondary | ICD-10-CM | POA: Diagnosis not present

## 2023-03-26 DIAGNOSIS — Z7951 Long term (current) use of inhaled steroids: Secondary | ICD-10-CM

## 2023-03-26 DIAGNOSIS — Z9882 Breast implant status: Secondary | ICD-10-CM

## 2023-03-26 DIAGNOSIS — N200 Calculus of kidney: Secondary | ICD-10-CM | POA: Diagnosis not present

## 2023-03-26 DIAGNOSIS — R109 Unspecified abdominal pain: Secondary | ICD-10-CM | POA: Diagnosis not present

## 2023-03-26 DIAGNOSIS — F419 Anxiety disorder, unspecified: Secondary | ICD-10-CM | POA: Diagnosis present

## 2023-03-26 LAB — COMPREHENSIVE METABOLIC PANEL
ALT: 12 U/L (ref 0–44)
AST: 15 U/L (ref 15–41)
Albumin: 4.1 g/dL (ref 3.5–5.0)
Alkaline Phosphatase: 61 U/L (ref 38–126)
Anion gap: 9 (ref 5–15)
BUN: 6 mg/dL (ref 6–20)
CO2: 20 mmol/L — ABNORMAL LOW (ref 22–32)
Calcium: 9 mg/dL (ref 8.9–10.3)
Chloride: 103 mmol/L (ref 98–111)
Creatinine, Ser: 0.66 mg/dL (ref 0.44–1.00)
GFR, Estimated: >60 mL/min (ref >60)
Glucose, Bld: 117 mg/dL — ABNORMAL HIGH (ref 70–99)
Potassium: 3.6 mmol/L (ref 3.5–5.1)
Sodium: 132 mmol/L — ABNORMAL LOW (ref 135–145)
Total Bilirubin: 0.6 mg/dL (ref 0.3–1.2)
Total Protein: 7.5 g/dL (ref 6.5–8.1)

## 2023-03-26 LAB — GASTROINTESTINAL PANEL BY PCR, STOOL (REPLACES STOOL CULTURE)

## 2023-03-26 LAB — URINALYSIS, ROUTINE W REFLEX MICROSCOPIC
Bacteria, UA: NONE SEEN
Bilirubin Urine: NEGATIVE
Glucose, UA: NEGATIVE mg/dL
Ketones, ur: 20 mg/dL — AB
Leukocytes,Ua: NEGATIVE
Nitrite: NEGATIVE
Protein, ur: NEGATIVE mg/dL
Specific Gravity, Urine: 1.016 (ref 1.005–1.030)
pH: 6 (ref 5.0–8.0)

## 2023-03-26 LAB — CBC
HCT: 44.2 % (ref 36.0–46.0)
Hemoglobin: 15.2 g/dL — ABNORMAL HIGH (ref 12.0–15.0)
MCH: 32 pg (ref 26.0–34.0)
MCHC: 34.4 g/dL (ref 30.0–36.0)
MCV: 93.1 fL (ref 80.0–100.0)
Platelets: 269 10*3/uL (ref 150–400)
RBC: 4.75 MIL/uL (ref 3.87–5.11)
RDW: 12.9 % (ref 11.5–15.5)
WBC: 14.2 10*3/uL — ABNORMAL HIGH (ref 4.0–10.5)
nRBC: 0 % (ref 0.0–0.2)

## 2023-03-26 LAB — C DIFFICILE QUICK SCREEN W PCR REFLEX
C Diff antigen: NEGATIVE
C Diff interpretation: NOT DETECTED
C Diff toxin: NEGATIVE

## 2023-03-26 LAB — POC URINE PREG, ED: Preg Test, Ur: NEGATIVE

## 2023-03-26 LAB — LIPASE, BLOOD: Lipase: 27 U/L (ref 11–51)

## 2023-03-26 MED ORDER — MORPHINE SULFATE (PF) 2 MG/ML IV SOLN
2.0000 mg | INTRAVENOUS | Status: DC | PRN
  Administered 2023-03-26 – 2023-03-27 (×6): 2 mg via INTRAVENOUS
  Filled 2023-03-26 (×6): qty 1

## 2023-03-26 MED ORDER — FLUTICASONE PROPIONATE 50 MCG/ACT NA SUSP: 1.0000 | Freq: Two times a day (BID) | NASAL | Status: DC | PRN

## 2023-03-26 MED ORDER — ONDANSETRON 4 MG PO TBDP: 4.0000 mg | ORAL_TABLET | Freq: Three times a day (TID) | ORAL | 0 refills | Status: DC | PRN

## 2023-03-26 MED ORDER — METRONIDAZOLE 500 MG/100ML IV SOLN
500.0000 mg | Freq: Three times a day (TID) | INTRAVENOUS | Status: DC
  Administered 2023-03-26 – 2023-03-30 (×11): 500 mg via INTRAVENOUS
  Filled 2023-03-26 (×12): qty 100

## 2023-03-26 MED ORDER — ENOXAPARIN SODIUM 40 MG/0.4ML IJ SOSY
40.0000 mg | PREFILLED_SYRINGE | INTRAMUSCULAR | Status: DC
  Administered 2023-03-26 – 2023-03-29 (×4): 40 mg via SUBCUTANEOUS
  Filled 2023-03-26 (×4): qty 0.4

## 2023-03-26 MED ORDER — LACTATED RINGERS IV SOLN: INTRAVENOUS | Status: DC

## 2023-03-26 MED ORDER — HYDRALAZINE HCL 20 MG/ML IJ SOLN: 10.0000 mg | Freq: Four times a day (QID) | INTRAMUSCULAR | Status: DC | PRN

## 2023-03-26 MED ORDER — ONDANSETRON HCL 4 MG/2ML IJ SOLN
4.0000 mg | Freq: Four times a day (QID) | INTRAMUSCULAR | Status: DC | PRN
  Administered 2023-03-26 – 2023-03-29 (×5): 4 mg via INTRAVENOUS
  Filled 2023-03-26 (×5): qty 2

## 2023-03-26 MED ORDER — SODIUM CHLORIDE 0.9 % IV SOLN: Freq: Once | INTRAVENOUS | Status: AC

## 2023-03-26 MED ORDER — SODIUM CHLORIDE 0.9 % IV SOLN
2.0000 g | INTRAVENOUS | Status: DC
  Administered 2023-03-27 – 2023-03-30 (×4): 2 g via INTRAVENOUS
  Filled 2023-03-26 (×4): qty 20

## 2023-03-26 MED ORDER — PSYLLIUM 95 % PO PACK
1.0000 | PACK | Freq: Every day | ORAL | Status: DC
  Administered 2023-03-27 – 2023-03-30 (×4): 1 via ORAL
  Filled 2023-03-26 (×4): qty 1

## 2023-03-26 MED ORDER — DICYCLOMINE HCL 20 MG PO TABS: 20.0000 mg | ORAL_TABLET | Freq: Three times a day (TID) | ORAL | 0 refills | Status: AC

## 2023-03-26 MED ORDER — KETOROLAC TROMETHAMINE 15 MG/ML IJ SOLN
15.0000 mg | Freq: Four times a day (QID) | INTRAMUSCULAR | Status: DC
  Administered 2023-03-26: 15 mg via INTRAVENOUS
  Filled 2023-03-26: qty 1

## 2023-03-26 MED ORDER — BUTALBITAL-APAP-CAFFEINE 50-325-40 MG PO TABS
1.0000 | ORAL_TABLET | ORAL | Status: DC | PRN
  Administered 2023-03-27 – 2023-03-30 (×7): 1 via ORAL
  Filled 2023-03-26 (×7): qty 1

## 2023-03-26 MED ORDER — MORPHINE SULFATE (PF) 4 MG/ML IV SOLN
4.0000 mg | Freq: Once | INTRAVENOUS | Status: AC
  Administered 2023-03-26: 4 mg via INTRAVENOUS
  Filled 2023-03-26: qty 1

## 2023-03-26 MED ORDER — ALUM & MAG HYDROXIDE-SIMETH 200-200-20 MG/5ML PO SUSP
30.0000 mL | Freq: Once | ORAL | Status: AC
  Administered 2023-03-26: 30 mL via ORAL
  Filled 2023-03-26: qty 30

## 2023-03-26 MED ORDER — SODIUM CHLORIDE 0.9 % IV BOLUS
1000.0000 mL | Freq: Once | INTRAVENOUS | Status: AC
  Administered 2023-03-26: 1000 mL via INTRAVENOUS

## 2023-03-26 MED ORDER — METRONIDAZOLE 500 MG PO TABS
500.0000 mg | ORAL_TABLET | Freq: Three times a day (TID) | ORAL | 0 refills | Status: DC
Start: 2023-03-26 — End: 2023-03-27

## 2023-03-26 MED ORDER — HYDROCODONE-ACETAMINOPHEN 5-325 MG PO TABS: 1.0000 | ORAL_TABLET | Freq: Four times a day (QID) | ORAL | 0 refills | Status: DC | PRN

## 2023-03-26 MED ORDER — AMPHETAMINE-DEXTROAMPHETAMINE 5 MG PO TABS: 15.0000 mg | ORAL_TABLET | Freq: Two times a day (BID) | ORAL | Status: DC

## 2023-03-26 MED ORDER — KETOROLAC TROMETHAMINE 30 MG/ML IJ SOLN
15.0000 mg | Freq: Once | INTRAMUSCULAR | Status: AC
  Administered 2023-03-26: 15 mg via INTRAVENOUS
  Filled 2023-03-26: qty 1

## 2023-03-26 MED ORDER — IOHEXOL 300 MG/ML  SOLN
100.0000 mL | Freq: Once | INTRAMUSCULAR | Status: AC | PRN
  Administered 2023-03-26: 100 mL via INTRAVENOUS

## 2023-03-26 MED ORDER — LACTATED RINGERS IV BOLUS: 1000.0000 mL | Freq: Once | INTRAVENOUS | Status: DC

## 2023-03-26 MED ORDER — OXYCODONE HCL 5 MG PO TABS
5.0000 mg | ORAL_TABLET | ORAL | Status: DC | PRN
  Administered 2023-03-26 – 2023-03-28 (×6): 5 mg via ORAL
  Filled 2023-03-26 (×8): qty 1

## 2023-03-26 MED ORDER — METRONIDAZOLE 500 MG PO TABS
500.0000 mg | ORAL_TABLET | Freq: Once | ORAL | Status: AC
  Administered 2023-03-26: 500 mg via ORAL
  Filled 2023-03-26: qty 1

## 2023-03-26 MED ORDER — ONDANSETRON HCL 4 MG PO TABS
4.0000 mg | ORAL_TABLET | Freq: Four times a day (QID) | ORAL | Status: DC | PRN
  Administered 2023-03-29 – 2023-03-30 (×2): 4 mg via ORAL
  Filled 2023-03-26 (×2): qty 1

## 2023-03-26 MED ORDER — SODIUM CHLORIDE 0.9 % IV SOLN
1.0000 g | Freq: Once | INTRAVENOUS | Status: AC
  Administered 2023-03-26: 1 g via INTRAVENOUS
  Filled 2023-03-26: qty 10

## 2023-03-26 MED ORDER — ACETAMINOPHEN 650 MG RE SUPP: 650.0000 mg | Freq: Four times a day (QID) | RECTAL | Status: DC | PRN

## 2023-03-26 MED ORDER — CIPROFLOXACIN HCL 500 MG PO TABS: 500.0000 mg | ORAL_TABLET | Freq: Once | ORAL | Status: DC

## 2023-03-26 MED ORDER — ONDANSETRON HCL 4 MG/2ML IJ SOLN
4.0000 mg | Freq: Once | INTRAMUSCULAR | Status: AC
  Administered 2023-03-26: 4 mg via INTRAVENOUS
  Filled 2023-03-26: qty 2

## 2023-03-26 MED ORDER — LACTATED RINGERS IV BOLUS
1000.0000 mL | Freq: Once | INTRAVENOUS | Status: AC
  Administered 2023-03-26: 1000 mL via INTRAVENOUS

## 2023-03-26 MED ORDER — PANTOPRAZOLE SODIUM 40 MG PO TBEC
40.0000 mg | DELAYED_RELEASE_TABLET | Freq: Two times a day (BID) | ORAL | Status: DC
  Administered 2023-03-26 – 2023-03-30 (×8): 40 mg via ORAL
  Filled 2023-03-26 (×8): qty 1

## 2023-03-26 MED ORDER — CIPROFLOXACIN HCL 500 MG PO TABS
500.0000 mg | ORAL_TABLET | Freq: Two times a day (BID) | ORAL | 0 refills | Status: DC
Start: 2023-03-26 — End: 2023-03-27

## 2023-03-26 MED ORDER — ALPRAZOLAM 0.5 MG PO TABS
0.5000 mg | ORAL_TABLET | Freq: Two times a day (BID) | ORAL | Status: DC | PRN
  Administered 2023-03-26 – 2023-03-29 (×5): 0.5 mg via ORAL
  Filled 2023-03-26 (×6): qty 1

## 2023-03-26 MED ORDER — ACETAMINOPHEN 325 MG PO TABS
650.0000 mg | ORAL_TABLET | Freq: Four times a day (QID) | ORAL | Status: DC | PRN
  Administered 2023-03-30: 650 mg via ORAL
  Filled 2023-03-26: qty 2

## 2023-03-26 MED ORDER — ALBUTEROL SULFATE (2.5 MG/3ML) 0.083% IN NEBU: 2.5000 mg | INHALATION_SOLUTION | RESPIRATORY_TRACT | Status: DC | PRN

## 2023-03-26 NOTE — H&P (Signed)
History and Physical    Diane Gamble ZOX:[id number] DOB: 1980/08/19 DOA: 03/26/2023  PCP: [facility]  Patient coming from: Home  I have personally briefly reviewed patient's old medical records in [facility]  Chief Complaint: Abdominal pain  HPI: Diane Gamble is a 42 y.o. female with medical history significant of anxiety, chronic pelvic pain, dysphagia requiring esophageal dilations, GERD who presents for evaluation of worsening abdominal pain over 2 days.  Patient was traveling with family and ate hibachi.  On weight: 2 days prior to presentation patient experienced acute onset of abdominal pain associated with multiple episodes of diarrhea, nonbloody.  Symptoms did not resolve spontaneously and patient presented to the emergency room.  On presentation patient is hemodynamically stable but in a fairly significant amount of pain.  CT scan done on admission revealed evidence of pancolitis, favor infectious etiology.  Of note patient had a relatively recent endoscopy and colonoscopy on 8/21.  No evidence of inflammation on endoscopy report from that time.  On my evaluation the patient is sitting up in bed.  She is in visible discomfort.  Endorses diffuse abdominal pain.  Husband at bedside.  Vital signs are stable.  ED Course: Patient was given several doses of morphine with limited effect.  CT scan findings as above.  Patient started on Rocephin and metronidazole.  Hospitalist contacted for admission.  Review of Systems: As per HPI otherwise 14 point review of systems negative.    Past Medical History:  Diagnosis Date   Anxiety    Cervical dysplasia    Interstitial cystitis    PONV (postoperative nausea and vomiting)     Past Surgical History:  Procedure Laterality Date   AUGMENTATION MAMMAPLASTY Bilateral 12/26/2020   CERVIX LESION DESTRUCTION     CYSTOSCOPY WITH HYDRODISTENSION AND BIOPSY  05/05/2007   INTRAUTERINE DEVICE (IUD) INSERTION  06/21/2021    KNEE ARTHROSCOPY WITH LATERAL RELEASE Left 08/02/2016   Procedure: LEFT KNEE ARTHROSCOPY WITH LATERAL RELEASE;  Surgeon: Sheral Apley, MD;  Location: [facility];  Service: Orthopedics;  Laterality: Left;   PLACEMENT OF BREAST IMPLANTS Bilateral    SYNOVECTOMY Left 08/02/2016   Procedure: SYNOVECTOMY OF LEFT KNEE;  Surgeon: Sheral Apley, MD;  Location: [facility];  Service: Orthopedics;  Laterality: Left;     reports that she has been smoking cigarettes. She has never used smokeless tobacco. She reports current alcohol use. She reports that she does not use drugs.  Allergies  Allergen Reactions   Doxycycline Nausea And Vomiting    Family History  Problem Relation Age of Onset   Diabetes Maternal Grandmother    Breast cancer Neg Hx    Colon cancer Neg Hx    Esophageal cancer Neg Hx    Stomach cancer Neg Hx    No family history of pancolitis  Prior to Admission medications   Medication Sig Start Date End Date Taking? Authorizing Provider  ALPRAZolam Prudy Feeler) 0.5 MG tablet Take 0.5 mg by mouth.   Yes [provider]  amphetamine-dextroamphetamine (ADDERALL) 15 MG tablet Take 1 tablet by mouth 2 (two) times daily. 03/10/23  Yes [provider]  ciprofloxacin (CIPRO) 500 MG tablet Take 1 tablet (500 mg total) by mouth 2 (two) times daily for 7 days. 03/26/23 04/02/23 Yes Shaune Pollack, MD  CRANBERRY PO Take by mouth as needed.   Yes [provider]  dicyclomine (BENTYL) 20 MG tablet Take 1 tablet (20 mg total) by mouth 3 (three)  times daily before meals for 5 days. 03/26/23 03/31/23 Yes Shaune Pollack, MD  fluticasone (FLONASE) 50 MCG/ACT nasal spray Place 1 spray into both nostrils 2 (two) times daily. 08/27/21  Yes Particia Nearing, PA-C  HYDROcodone-acetaminophen (NORCO/VICODIN) 5-325 MG tablet Take 1-2 tablets by mouth every 6 (six) hours as needed for moderate pain or severe pain (no more than 6 tabs daily). 03/26/23 03/25/24  Yes Shaune Pollack, MD  levonorgestrel (MIRENA) 20 MCG/DAY IUD 1 each by Intrauterine route once. 06/21/21  Yes [provider]  metroNIDAZOLE (FLAGYL) 500 MG tablet Take 1 tablet (500 mg total) by mouth 3 (three) times daily for 7 days. 03/26/23 04/02/23 Yes Shaune Pollack, MD  ondansetron (ZOFRAN-ODT) 4 MG disintegrating tablet Take 1 tablet (4 mg total) by mouth every 8 (eight) hours as needed for nausea or vomiting. 03/26/23  Yes Shaune Pollack, MD  promethazine-dextromethorphan (PROMETHAZINE-DM) 6.25-15 MG/5ML syrup Take 5 mLs by mouth 4 (four) times daily as needed. 08/27/21  Yes Particia Nearing, PA-C  psyllium (REGULOID) 0.52 g capsule Take 0.52 g by mouth daily.   Yes [provider]  albuterol (VENTOLIN HFA) 108 (90 Base) MCG/ACT inhaler Inhale 1-2 puffs into the lungs every 6 (six) hours as needed for wheezing or shortness of breath. Patient not taking: Reported on 01/17/2023 06/18/21   Particia Nearing, PA-C  famotidine (PEPCID) 20 MG tablet Take 1 tablet (20 mg total) by mouth 2 (two) times daily for 5 days. 06/21/21 06/26/21  Malheur Bing, MD  fluticasone (FLOVENT HFA) 110 MCG/ACT inhaler Inhale 2 puffs into the lungs 2 (two) times daily. Rinse mouth with water after each use Patient not taking: Reported on 03/26/2023 06/18/21   Particia Nearing, PA-C  methocarbamol (ROBAXIN) 500 MG tablet Take 1 tablet (500 mg total) by mouth every 6 (six) hours as needed for muscle spasms. Patient not taking: Reported on 01/17/2023 06/25/21   Delton Prairie, MD    Physical Exam: Vitals:   03/26/23 1004 03/26/23 1005 03/26/23 1006 03/26/23 1432  BP:   111/84 (!) 159/94  Pulse:   (!) 105 72  Resp:   16 20  Temp: 98 F (36.7 C)   98.1 F (36.7 C)  TempSrc:    Oral  SpO2:   99% 100%  Weight:  65.8 kg    Height:  5\' 6"  (1.676 m)      Vitals:   03/26/23 1004 03/26/23 1005 03/26/23 1006 03/26/23 1432  BP:   111/84 (!) 159/94  Pulse:   (!) 105 72  Resp:   16 20   Temp: 98 F (36.7 C)   98.1 F (36.7 C)  TempSrc:    Oral  SpO2:   99% 100%  Weight:  65.8 kg    Height:  5\' 6"  (1.676 m)     General: Appears in discomfort HEENT: Normocephalic, atraumatic Neck, supple, trachea midline, no tenderness Heart: Regular rate and rhythm, S1/S2 normal, no murmurs Lungs: Lungs clear.  Normal work of breathing.  Room air Abdomen: Soft, nondistended, diffuse tender to palpation, positive bowel sounds Extremities: Normal, atraumatic, no clubbing or cyanosis, normal muscle tone Skin: No rashes or lesions, normal color Neurologic: Cranial nerves grossly intact, sensation intact, alert and oriented x3 Psychiatric: Normal affect   Labs on Admission: I have personally reviewed following labs and imaging studies  CBC: Recent Labs  Lab 03/26/23 1004  WBC 14.2*  HGB 15.2*  HCT 44.2  MCV 93.1  PLT 269   Basic Metabolic Panel: Recent  Labs  Lab 03/26/23 1004  NA 132*  K 3.6  CL 103  CO2 20*  GLUCOSE 117*  BUN 6  CREATININE 0.66  CALCIUM 9.0   GFR: Estimated Creatinine Clearance: 84 mL/min (by C-G formula based on SCr of 0.66 mg/dL). Liver Function Tests: Recent Labs  Lab 03/26/23 1004  AST 15  ALT 12  ALKPHOS 61  BILITOT 0.6  PROT 7.5  ALBUMIN 4.1   Recent Labs  Lab 03/26/23 1004  LIPASE 27   No results for input(s): "AMMONIA" in the last 168 hours. Coagulation Profile: No results for input(s): "INR", "PROTIME" in the last 168 hours. Cardiac Enzymes: No results for input(s): "CKTOTAL", "CKMB", "CKMBINDEX", "TROPONINI" in the last 168 hours. BNP (last 3 results) No results for input(s): "PROBNP" in the last 8760 hours. HbA1C: No results for input(s): "HGBA1C" in the last 72 hours. CBG: No results for input(s): "GLUCAP" in the last 168 hours. Lipid Profile: No results for input(s): "CHOL", "HDL", "LDLCALC", "TRIG", "CHOLHDL", "LDLDIRECT" in the last 72 hours. Thyroid Function Tests: No results for input(s): "TSH", "T4TOTAL",  "FREET4", "T3FREE", "THYROIDAB" in the last 72 hours. Anemia Panel: No results for input(s): "VITAMINB12", "FOLATE", "FERRITIN", "TIBC", "IRON", "RETICCTPCT" in the last 72 hours. Urine analysis:    Component Value Date/Time   COLORURINE YELLOW (A) 03/26/2023 1056   APPEARANCEUR HAZY (A) 03/26/2023 1056   APPEARANCEUR Clear 02/09/2020 1400   LABSPEC 1.016 03/26/2023 1056   PHURINE 6.0 03/26/2023 1056   GLUCOSEU NEGATIVE 03/26/2023 1056   HGBUR MODERATE (A) 03/26/2023 1056   BILIRUBINUR NEGATIVE 03/26/2023 1056   BILIRUBINUR Negative 02/09/2020 1400   KETONESUR 20 (A) 03/26/2023 1056   PROTEINUR NEGATIVE 03/26/2023 1056   UROBILINOGEN 1.0 11/30/2007 1426   NITRITE NEGATIVE 03/26/2023 1056   LEUKOCYTESUR NEGATIVE 03/26/2023 1056    Radiological Exams on Admission: CT ABDOMEN PELVIS W CONTRAST  Result Date: 03/26/2023 CLINICAL DATA:  Abdominal pain and diarrhea for 3 days. EXAM: CT ABDOMEN AND PELVIS WITH CONTRAST TECHNIQUE: Multidetector CT imaging of the abdomen and pelvis was performed using the standard protocol following bolus administration of intravenous contrast. RADIATION DOSE REDUCTION: This exam was performed according to the departmental dose-optimization program which includes automated exposure control, adjustment of the mA and/or kV according to patient size and/or use of iterative reconstruction technique. CONTRAST:  OMNIPAQUE IOHEXOL 300 MG/ML  SOLN COMPARISON:  01/22/2023 FINDINGS: Lower Chest: No acute findings. Hepatobiliary: No suspicious hepatic masses identified. Stable tiny sub-cm cyst in left hepatic lobe. Gallbladder is unremarkable. No evidence of biliary ductal dilatation. Pancreas:  No mass or inflammatory changes. Spleen: Within normal limits in size and appearance. Adrenals/Urinary Tract: No suspicious masses identified. 3 mm calculus again seen in lower pole of right kidney. No evidence of ureteral calculi or hydronephrosis. Stomach/Bowel: Mild wall  thickening is seen throughout the ascending, transverse and descending colon, and involving the terminal ileum. This is consistent with enterocolitis. No evidence of perforation or abscess. Normal appendix visualized. Vascular/Lymphatic: No pathologically enlarged lymph nodes. No acute vascular findings. Reproductive: IUD seen in expected position. No mass or other significant abnormality. Other:  None. Musculoskeletal:  No suspicious bone lesions identified. IMPRESSION: Enterocolitis involving the terminal ileum and ascending, transverse, and descending colon. No evidence of perforation or abscess. This favors an infectious etiology, with Crohn disease or ulcerative colitis considered less likely. Stable tiny nonobstructing right renal calculus. Electronically Signed   By: Danae Orleans M.D.   On: 03/26/2023 13:40    EKG: Not visible at  time of this note  Assessment/Plan Principal Problem:   Pancolitis (HCC)  Pancolitis Diarrhea Unclear etiology.  Favor infectious.  Ischemic versus inflammatory colitis remains on differential. Plan: Admit inpatient IV ciprofloxacin IV metronidazole Multimodal pain control Full liquid diet Check stool studies, C. difficile and GI PCR panel Isolation precautions Monitor vitals and fever curve  Anxiety PTA Xanax as needed  Hyponatremia Mild, sodium 132 Likely related to volume loss from diarrhea IV fluids as above Repeat renal indices in a.m.  DVT prophylaxis: SQ Lovenox Code Status: Full Family Communication: Husband bedside 9/4 Disposition Plan: Anticipate return to previous home environment Consults called: None Admission status: Inpatient MedSurg   Tresa Moore MD [facility]   If 7PM-7AM, please contact night-coverage   03/26/2023, 4:37 PM

## 2023-03-26 NOTE — ED Triage Notes (Signed)
Pt to ED for diarrhea x3 days. Reports mid abd pain started yesterday. Recently had esophagus stretched.

## 2023-03-26 NOTE — ED Provider Notes (Signed)
[facility] Provider Note    Event Date/Time   First MD Initiated Contact with Patient 03/26/23 1057     (approximate)   History   Abdominal Pain and Diarrhea   HPI  Diane Gamble is a 43 y.o. female here with abdominal pain, diarrhea.  The patient states that she has a history of chronic GI issues.  She actually just had her esophagus stretched several weeks ago.  She states that over the last 3 days, she has had nausea, abdominal pain, and diarrhea.  [place]  She states that people there did have COVID but this was several weeks ago.  She states that over the last 2 to 3 days, she has had diffuse, aching, cramping, severe abdominal pain.  She has had watery diarrhea.  No blood.     Physical Exam   Triage Vital Signs: ED Triage Vitals  Encounter Vitals Group     BP 03/26/23 1006 111/84     Systolic BP Percentile --      Diastolic BP Percentile --      Pulse Rate 03/26/23 1006 (!) 105     Resp 03/26/23 1006 16     Temp 03/26/23 1004 98 F (36.7 C)     Temp src --      SpO2 03/26/23 1006 99 %     Weight 03/26/23 1005 145 lb (65.8 kg)     Height 03/26/23 1005 5\' 6"  (1.676 m)     Head Circumference --      Peak Flow --      Pain Score 03/26/23 1004 10     Pain Loc --      Pain Education --      Exclude from Growth Chart --     Most recent vital signs: Vitals:   03/26/23 1853 03/26/23 1916  BP: 117/73 109/71  Pulse: 87 79  Resp: 16 20  Temp:  98.4 F (36.9 C)  SpO2: 100% 100%     General: Awake, no distress.  CV:  Good peripheral perfusion.  Regular rate and rhythm.  No murmurs. Resp:  Normal work of breathing.  Abd:  No distention.  Moderate diffuse tenderness.  Slight guarding.  No rebound.  No peritonitis. Other:  Mildly dry mucous membranes.   ED Results / Procedures / Treatments   Labs (all labs ordered are listed, but only abnormal results are displayed) Labs Reviewed  GASTROINTESTINAL PANEL BY  PCR, STOOL (REPLACES STOOL CULTURE) - Abnormal; Notable for the following components:      Result Value   Campylobacter species DETECTED (*)    Enteropathogenic E coli (EPEC) DETECTED (*)    All other components within normal limits  COMPREHENSIVE METABOLIC PANEL - Abnormal; Notable for the following components:   Sodium 132 (*)    CO2 20 (*)    Glucose, Bld 117 (*)    All other components within normal limits  CBC - Abnormal; Notable for the following components:   WBC 14.2 (*)    Hemoglobin 15.2 (*)    All other components within normal limits  URINALYSIS, ROUTINE W REFLEX MICROSCOPIC - Abnormal; Notable for the following components:   Color, Urine YELLOW (*)    APPearance HAZY (*)    Hgb urine dipstick MODERATE (*)    Ketones, ur 20 (*)    All other components within normal limits  POC URINE PREG, ED - Normal  C DIFFICILE QUICK SCREEN W PCR REFLEX  LIPASE, BLOOD  HIV ANTIBODY (ROUTINE TESTING W REFLEX)  BASIC METABOLIC PANEL  CBC  MISCELLANEOUS TEST     EKG Normal sinus rhythm, ventricular at 99.  PR 110, QRS 70, QTc 410.  No acute ST elevations or depressions.  No EKG notes of acute ischemia or infarct.   RADIOLOGY CT abdomen/pelvis: Enterocolitis involving the terminal ileum and a ascending, transverse, and descending colon   I also independently reviewed and agree with radiologist interpretations.   PROCEDURES:  Critical Care performed: No   MEDICATIONS ORDERED IN ED: Medications  ALPRAZolam (XANAX) tablet 0.5 mg (has no administration in time range)  amphetamine-dextroamphetamine (ADDERALL) tablet 15 mg (has no administration in time range)  psyllium (HYDROCIL/METAMUCIL) 1 packet (has no administration in time range)  fluticasone (FLONASE) 50 MCG/ACT nasal spray 1 spray (has no administration in time range)  enoxaparin (LOVENOX) injection 40 mg (has no administration in time range)  lactated ringers infusion ( Intravenous New Bag/Given 03/26/23 1841)   acetaminophen (TYLENOL) tablet 650 mg (has no administration in time range)    Or  acetaminophen (TYLENOL) suppository 650 mg (has no administration in time range)  oxyCODONE (Oxy IR/ROXICODONE) immediate release tablet 5 mg (has no administration in time range)  morphine (PF) 2 MG/ML injection 2 mg (2 mg Intravenous Given 03/26/23 1924)  ondansetron (ZOFRAN) tablet 4 mg (has no administration in time range)    Or  ondansetron (ZOFRAN) injection 4 mg (has no administration in time range)  albuterol (PROVENTIL) (2.5 MG/3ML) 0.083% nebulizer solution 2.5 mg (has no administration in time range)  hydrALAZINE (APRESOLINE) injection 10 mg (has no administration in time range)  cefTRIAXone (ROCEPHIN) 2 g in sodium chloride 0.9 % 100 mL IVPB (has no administration in time range)  metroNIDAZOLE (FLAGYL) IVPB 500 mg (has no administration in time range)  butalbital-acetaminophen-caffeine (FIORICET) 50-325-40 MG per tablet 1 tablet (has no administration in time range)  pantoprazole (PROTONIX) EC tablet 40 mg (has no administration in time range)  ketorolac (TORADOL) 15 MG/ML injection 15 mg (15 mg Intravenous Given 03/26/23 1838)  lactated ringers bolus 1,000 mL (0 mLs Intravenous Stopped 03/26/23 1320)  morphine (PF) 4 MG/ML injection 4 mg (4 mg Intravenous Given 03/26/23 1137)  ketorolac (TORADOL) 30 MG/ML injection 15 mg (15 mg Intravenous Given 03/26/23 1136)  ondansetron (ZOFRAN) injection 4 mg (4 mg Intravenous Given 03/26/23 1135)  iohexol (OMNIPAQUE) 300 MG/ML solution 100 mL (100 mLs Intravenous Contrast Given 03/26/23 1145)  metroNIDAZOLE (FLAGYL) tablet 500 mg (500 mg Oral Given 03/26/23 1355)  morphine (PF) 4 MG/ML injection 4 mg (4 mg Intravenous Given 03/26/23 1359)  cefTRIAXone (ROCEPHIN) 1 g in sodium chloride 0.9 % 100 mL IVPB (0 g Intravenous Stopped 03/26/23 1504)  sodium chloride 0.9 % bolus 1,000 mL (0 mLs Intravenous Stopped 03/26/23 1504)  alum & mag hydroxide-simeth (MAALOX/MYLANTA) 200-200-20  MG/5ML suspension 30 mL (30 mLs Oral Given 03/26/23 1507)  0.9 %  sodium chloride infusion ( Intravenous New Bag/Given 03/26/23 1604)  morphine (PF) 4 MG/ML injection 4 mg (4 mg Intravenous Given 03/26/23 1555)     IMPRESSION / MDM / ASSESSMENT AND PLAN / ED COURSE  I reviewed the triage vital signs and the nursing notes.                              Differential diagnosis includes, but is not limited to, colitis, diverticulitis, enteritis, obstruction, cholecystitis, appendicitis, foodborne illness  Patient's presentation is most  consistent with acute presentation with potential threat to life or bodily function.  The patient is on the cardiac monitor to evaluate for evidence of arrhythmia and/or significant heart rate changes   42 year old female here with abdominal pain, diarrhea, dehydration.  Lab work shows moderate leukocytosis and mild dehydration.  She has been given fluids.  UA with ketonuria consistent dehydration.  2 L fluid given as mentioned.  Patient CT scan obtained, reviewed, shows diffuse colitis.  She has not recently been on antibiotics.  Suspect infectious colitis with possible food related illness.  Given the extent of her symptoms, absence of any bleeding, absence of any risk factors for C. difficile, will treat empirically with Cipro and Flagyl .  In ED, pt has persistent, severe pain despite iv analgesia, fluids, and ABX. She continues to have difficulty tolerating PO. Will admit.   FINAL CLINICAL IMPRESSION(S) / ED DIAGNOSES   Final diagnoses:  Colitis     Rx / DC Orders      Note:  This document was prepared using Dragon voice recognition software and may include unintentional dictation errors.   Shaune Pollack, MD 03/26/23 [phone]

## 2023-03-26 NOTE — Discharge Instructions (Signed)
Take the medications as prescribed.  Take the Bentyl for cramps/spasms. I'd recommend taking this three times with meals and at night for 5 days.  Take the Norco for severe pain.  Take the antibiotics for a full course.   Take the Zofran as needed for nausea.  Consider starting an over-the-counter probiotic or eating yogurt with "active cultures" while taking the antibiotics.

## 2023-03-27 DIAGNOSIS — K5100 Ulcerative (chronic) pancolitis without complications: Secondary | ICD-10-CM | POA: Diagnosis not present

## 2023-03-27 LAB — BASIC METABOLIC PANEL
Anion gap: 7 (ref 5–15)
BUN: 6 mg/dL (ref 6–20)
CO2: 21 mmol/L — ABNORMAL LOW (ref 22–32)
Calcium: 7.8 mg/dL — ABNORMAL LOW (ref 8.9–10.3)
Chloride: 108 mmol/L (ref 98–111)
Creatinine, Ser: 0.63 mg/dL (ref 0.44–1.00)
GFR, Estimated: >60 mL/min (ref >60)
Glucose, Bld: 100 mg/dL — ABNORMAL HIGH (ref 70–99)
Potassium: 3.4 mmol/L — ABNORMAL LOW (ref 3.5–5.1)
Sodium: 136 mmol/L (ref 135–145)

## 2023-03-27 LAB — CBC
HCT: 33.6 % — ABNORMAL LOW (ref 36.0–46.0)
Hemoglobin: 12 g/dL (ref 12.0–15.0)
MCH: 33.1 pg (ref 26.0–34.0)
MCHC: 35.7 g/dL (ref 30.0–36.0)
MCV: 92.6 fL (ref 80.0–100.0)
Platelets: 205 10*3/uL (ref 150–400)
RBC: 3.63 MIL/uL — ABNORMAL LOW (ref 3.87–5.11)
RDW: 13.2 % (ref 11.5–15.5)
WBC: 8.8 10*3/uL (ref 4.0–10.5)
nRBC: 0 % (ref 0.0–0.2)

## 2023-03-27 LAB — HIV ANTIBODY (ROUTINE TESTING W REFLEX): HIV Screen 4th Generation wRfx: NONREACTIVE

## 2023-03-27 MED ORDER — ORAL CARE MOUTH RINSE: 15.0000 mL | OROMUCOSAL | Status: DC | PRN

## 2023-03-27 MED ORDER — PANTOPRAZOLE SODIUM 40 MG PO TBEC: 40.0000 mg | DELAYED_RELEASE_TABLET | Freq: Every day | ORAL | 0 refills | Status: AC

## 2023-03-27 MED ORDER — CIPROFLOXACIN HCL 500 MG PO TABS
500.0000 mg | ORAL_TABLET | Freq: Two times a day (BID) | ORAL | 0 refills | Status: DC
Start: 2023-03-28 — End: 2023-03-30

## 2023-03-27 MED ORDER — HYDROMORPHONE HCL 1 MG/ML IJ SOLN
0.5000 mg | INTRAMUSCULAR | Status: DC | PRN
  Administered 2023-03-27 – 2023-03-28 (×6): 0.5 mg via INTRAVENOUS
  Filled 2023-03-27 (×6): qty 0.5

## 2023-03-27 MED ORDER — ALUM & MAG HYDROXIDE-SIMETH 200-200-20 MG/5ML PO SUSP
30.0000 mL | ORAL | Status: DC | PRN
  Administered 2023-03-27: 30 mL via ORAL
  Filled 2023-03-27: qty 30

## 2023-03-27 MED ORDER — METRONIDAZOLE 500 MG PO TABS: 500.0000 mg | ORAL_TABLET | Freq: Three times a day (TID) | ORAL | 0 refills | Status: DC

## 2023-03-27 MED ORDER — SODIUM CHLORIDE 0.9 % IV SOLN
12.5000 mg | INTRAVENOUS | Status: DC | PRN
  Administered 2023-03-27 – 2023-03-30 (×6): 12.5 mg via INTRAVENOUS
  Filled 2023-03-27 (×4): qty 12.5
  Filled 2023-03-27: qty 0.5
  Filled 2023-03-27: qty 12.5

## 2023-03-27 NOTE — Progress Notes (Signed)
PROGRESS NOTE    Diane Gamble  GLO:[id number] DOB: January 18, 1981 DOA: 03/26/2023 PCP: [facility]    Brief Narrative:  42 y.o. female with medical history significant of anxiety, chronic pelvic pain, dysphagia requiring esophageal dilations, GERD who presents for evaluation of worsening abdominal pain over 2 days.  Patient was traveling with family and ate hibachi.  On weight: 2 days prior to presentation patient experienced acute onset of abdominal pain associated with multiple episodes of diarrhea, nonbloody.   Symptoms did not resolve spontaneously and patient presented to the emergency room.  On presentation patient is hemodynamically stable but in a fairly significant amount of pain.  CT scan done on admission revealed evidence of pancolitis, favor infectious etiology.  Of note patient had a relatively recent endoscopy and colonoscopy on 8/21.  No evidence of inflammation on endoscopy report from that time.  9/5: GI PCR panel positive for Campylobacter and enteropathogenic E. coli.   Assessment & Plan:   Principal Problem:   Pancolitis (HCC)  Pancolitis Diarrhea Campylobacter infection Enteropathogenic E. coli infection Likely foodborne diarrheal illness resulting in colitis.  Lower suspicion for ischemic or inflammatory colitis at this time.  Patient clinically improving. Plan: Continue IV Rocephin.  Continue IV metronidazole.  Continue multimodal pain control.  Continue full liquid diet for today.  Continue isolation precautions.  Monitor vitals and fever curve.  Possible discharge in 1 to 2 days.   Anxiety PTA Xanax as needed   Hyponatremia Now recovered.  Sodium up to 136 Likely related to volume loss from diarrhea IV fluids as above  Headache Responded to Fioricet.  Will continue as needed    DVT prophylaxis: SQ Lovenox Code Status: Full Family Communication: Spouse at bedside 9/5 Disposition Plan: Status is: Inpatient Remains inpatient appropriate  because: Infectious diarrhea on IV antibiotics   Level of care: Med-Surg  Consultants:  None  Procedures:  None  Antimicrobials: Rocephin Metronidazole   Subjective: Seen and examined.  Resting in bed.  Still with some abdominal pain and headache but improving over interval  Objective: Vitals:   03/26/23 1853 03/26/23 1916 03/27/23 0117 03/27/23 0743  BP: 117/73 109/71 (!) 94/56 104/74  Pulse: 87 79 72 86  Resp: 16 20 16 20   Temp:  98.4 F (36.9 C) 98.8 F (37.1 C) 98.4 F (36.9 C)  TempSrc:  Oral Oral Oral  SpO2: 100% 100% 99% 100%  Weight:      Height:        Intake/Output Summary (Last 24 hours) at 03/27/2023 1103 Last data filed at 03/27/2023 1025 Gross per 24 hour  Intake 3571.58 ml  Output 0 ml  Net 3571.58 ml   Filed Weights   03/26/23 1005  Weight: 65.8 kg    Examination:  General exam: Appears calm and comfortable  Respiratory system: Clear to auscultation. Respiratory effort normal. Cardiovascular system: S1-S2, RRR, no murmurs, no pedal edema Gastrointestinal system: Soft, nondistended, mild TTP, normal bowel sounds Central nervous system: Alert and oriented. No focal neurological deficits. Extremities: Symmetric 5 x 5 power. Skin: No rashes, lesions or ulcers Psychiatry: Judgement and insight appear normal. Mood & affect appropriate.     Data Reviewed: I have personally reviewed following labs and imaging studies  CBC: Recent Labs  Lab 03/26/23 1004 03/27/23 0645  WBC 14.2* 8.8  HGB 15.2* 12.0  HCT 44.2 33.6*  MCV 93.1 92.6  PLT 269 205   Basic Metabolic Panel: Recent Labs  Lab 03/26/23 1004 03/27/23 0645  NA 132* 136  K 3.6 3.4*  CL 103 108  CO2 20* 21*  GLUCOSE 117* 100*  BUN 6 6  CREATININE 0.66 0.63  CALCIUM 9.0 7.8*   GFR: Estimated Creatinine Clearance: 84 mL/min (by C-G formula based on SCr of 0.63 mg/dL). Liver Function Tests: Recent Labs  Lab 03/26/23 1004  AST 15  ALT 12  ALKPHOS 61  BILITOT 0.6  PROT  7.5  ALBUMIN 4.1   Recent Labs  Lab 03/26/23 1004  LIPASE 27   No results for input(s): "AMMONIA" in the last 168 hours. Coagulation Profile: No results for input(s): "INR", "PROTIME" in the last 168 hours. Cardiac Enzymes: No results for input(s): "CKTOTAL", "CKMB", "CKMBINDEX", "TROPONINI" in the last 168 hours. BNP (last 3 results) No results for input(s): "PROBNP" in the last 8760 hours. HbA1C: No results for input(s): "HGBA1C" in the last 72 hours. CBG: No results for input(s): "GLUCAP" in the last 168 hours. Lipid Profile: No results for input(s): "CHOL", "HDL", "LDLCALC", "TRIG", "CHOLHDL", "LDLDIRECT" in the last 72 hours. Thyroid Function Tests: No results for input(s): "TSH", "T4TOTAL", "FREET4", "T3FREE", "THYROIDAB" in the last 72 hours. Anemia Panel: No results for input(s): "VITAMINB12", "FOLATE", "FERRITIN", "TIBC", "IRON", "RETICCTPCT" in the last 72 hours. Sepsis Labs: No results for input(s): "PROCALCITON", "LATICACIDVEN" in the last 168 hours.  Recent Results (from the past 240 hour(s))  Gastrointestinal Panel by PCR , Stool     Status: Abnormal   Collection Time: 03/26/23  4:56 PM   Specimen: Stool  Result Value Ref Range Status   Campylobacter species DETECTED (A) NOT DETECTED Final    Comment: RESULT CALLED TO, READ BACK BY AND VERIFIED WITH: NOELLE THOMAS 03/26/23 1829 MU    Plesimonas shigelloides NOT DETECTED NOT DETECTED Final   Salmonella species NOT DETECTED NOT DETECTED Final   Yersinia enterocolitica NOT DETECTED NOT DETECTED Final   Vibrio species NOT DETECTED NOT DETECTED Final   Vibrio cholerae NOT DETECTED NOT DETECTED Final   Enteroaggregative E coli (EAEC) NOT DETECTED NOT DETECTED Final   Enteropathogenic E coli (EPEC) DETECTED (A) NOT DETECTED Final    Comment: RESULT CALLED TO, READ BACK BY AND VERIFIED WITH: NOELLE THOMAS 03/26/23 1829 MU    Enterotoxigenic E coli (ETEC) NOT DETECTED NOT DETECTED Final   Shiga like toxin producing E  coli (STEC) NOT DETECTED NOT DETECTED Final   Shigella/Enteroinvasive E coli (EIEC) NOT DETECTED NOT DETECTED Final   Cryptosporidium NOT DETECTED NOT DETECTED Final   Cyclospora cayetanensis NOT DETECTED NOT DETECTED Final   Entamoeba histolytica NOT DETECTED NOT DETECTED Final   Giardia lamblia NOT DETECTED NOT DETECTED Final   Adenovirus F40/41 NOT DETECTED NOT DETECTED Final   Astrovirus NOT DETECTED NOT DETECTED Final   Norovirus GI/GII NOT DETECTED NOT DETECTED Final   Rotavirus A NOT DETECTED NOT DETECTED Final   Sapovirus (I, II, IV, and V) NOT DETECTED NOT DETECTED Final    Comment: Performed at [facility], [address] [place], [place] [address]  C Difficile Quick Screen w PCR reflex     Status: None   Collection Time: 03/26/23  4:56 PM   Specimen: Stool  Result Value Ref Range Status   C Diff antigen NEGATIVE NEGATIVE Final   C Diff toxin NEGATIVE NEGATIVE Final   C Diff interpretation No C. difficile detected.  Final    Comment: Performed at [facility], [address] [place], [place] [address]         Radiology Studies: CT ABDOMEN PELVIS  W CONTRAST  Result Date: 03/26/2023 CLINICAL DATA:  Abdominal pain and diarrhea for 3 days. EXAM: CT ABDOMEN AND PELVIS WITH CONTRAST TECHNIQUE: Multidetector CT imaging of the abdomen and pelvis was performed using the standard protocol following bolus administration of intravenous contrast. RADIATION DOSE REDUCTION: This exam was performed according to the departmental dose-optimization program which includes automated exposure control, adjustment of the mA and/or kV according to patient size and/or use of iterative reconstruction technique. CONTRAST:  OMNIPAQUE IOHEXOL 300 MG/ML  SOLN COMPARISON:  01/22/2023 FINDINGS: Lower Chest: No acute findings. Hepatobiliary: No suspicious hepatic masses identified. Stable tiny sub-cm cyst in left hepatic lobe. Gallbladder is unremarkable. No evidence of biliary  ductal dilatation. Pancreas:  No mass or inflammatory changes. Spleen: Within normal limits in size and appearance. Adrenals/Urinary Tract: No suspicious masses identified. 3 mm calculus again seen in lower pole of right kidney. No evidence of ureteral calculi or hydronephrosis. Stomach/Bowel: Mild wall thickening is seen throughout the ascending, transverse and descending colon, and involving the terminal ileum. This is consistent with enterocolitis. No evidence of perforation or abscess. Normal appendix visualized. Vascular/Lymphatic: No pathologically enlarged lymph nodes. No acute vascular findings. Reproductive: IUD seen in expected position. No mass or other significant abnormality. Other:  None. Musculoskeletal:  No suspicious bone lesions identified. IMPRESSION: Enterocolitis involving the terminal ileum and ascending, transverse, and descending colon. No evidence of perforation or abscess. This favors an infectious etiology, with Crohn disease or ulcerative colitis considered less likely. Stable tiny nonobstructing right renal calculus. Electronically Signed   By: Danae Orleans M.D.   On: 03/26/2023 13:40        Scheduled Meds:  [START ON 03/28/2023] amphetamine-dextroamphetamine  15 mg Oral BID   enoxaparin (LOVENOX) injection  40 mg Subcutaneous Q24H   pantoprazole  40 mg Oral BID   psyllium  1 packet Oral Daily   Continuous Infusions:  cefTRIAXone (ROCEPHIN)  IV 2 g (03/27/23 1032)   lactated ringers 125 mL/hr at 03/27/23 0643   metronidazole 500 mg (03/27/23 0645)   promethazine (PHENERGAN) injection (IM or IVPB) 12.5 mg (03/27/23 0802)     LOS: 1 day     Tresa Moore, MD [facility]   If 7PM-7AM, please contact night-coverage  03/27/2023, 11:03 AM

## 2023-03-27 NOTE — TOC CM/SW Note (Signed)
Transition of Care [facility]) - Inpatient Brief Assessment   Patient Details  Name: Diane Gamble MRN: [id number] Date of Birth: 07/10/1981  [facility] of [facility] CM/SW Contact:    Margarito Liner, LCSW Phone Number: 03/27/2023, 4:19 PM   Clinical Narrative: Patient has orders to discharge home today. CSW reviewed chart. No TOC needs identified. CSW signing off.  Transition of Care Asessment: Insurance and Status: Insurance coverage has been reviewed Patient has primary care physician: Yes Home environment has been reviewed: Single family home Prior level of function:: Not documented Prior/Current Home Services: No current home services Social Determinants of Health Reivew: SDOH reviewed no interventions necessary Readmission risk has been reviewed: Yes Transition of care needs: no transition of care needs at this time

## 2023-03-27 NOTE — Plan of Care (Signed)
°  Problem: Education: °Goal: Knowledge of General Education information will improve °Description: Including pain rating scale, medication(s)/side effects and non-pharmacologic comfort measures °Outcome: Progressing °  °Problem: Clinical Measurements: °Goal: Ability to maintain clinical measurements within normal limits will improve °Outcome: Progressing °Goal: Will remain free from infection °Outcome: Progressing °Goal: Diagnostic test results will improve °Outcome: Progressing °Goal: Respiratory complications will improve °Outcome: Progressing °Goal: Cardiovascular complication will be avoided °Outcome: Progressing °  °Problem: Activity: °Goal: Risk for activity intolerance will decrease °Outcome: Progressing °  °Problem: Coping: °Goal: Level of anxiety will decrease °Outcome: Progressing °  °Problem: Elimination: °Goal: Will not experience complications related to bowel motility °Outcome: Progressing °Goal: Will not experience complications related to urinary retention °Outcome: Progressing °  °Problem: Safety: °Goal: Ability to remain free from injury will improve °Outcome: Progressing °  °Problem: Skin Integrity: °Goal: Risk for impaired skin integrity will decrease °Outcome: Progressing °  °

## 2023-03-27 NOTE — Plan of Care (Signed)

## 2023-03-28 ENCOUNTER — Inpatient Hospital Stay: Payer: BC Managed Care – PPO

## 2023-03-28 DIAGNOSIS — K5100 Ulcerative (chronic) pancolitis without complications: Secondary | ICD-10-CM | POA: Diagnosis not present

## 2023-03-28 LAB — CBC WITH DIFFERENTIAL/PLATELET
Abs Immature Granulocytes: 0.02 10*3/uL (ref 0.00–0.07)
Abs Immature Granulocytes: 0.02 10*3/uL (ref 0.00–0.07)
Basophils Absolute: 0 10*3/uL (ref 0.0–0.1)
Basophils Absolute: 0 10*3/uL (ref 0.0–0.1)
Basophils Relative: 0 %
Basophils Relative: 1 %
Eosinophils Absolute: 0.4 10*3/uL (ref 0.0–0.5)
Eosinophils Absolute: 0.4 10*3/uL (ref 0.0–0.5)
Eosinophils Relative: 4 %
Eosinophils Relative: 7 %
HCT: 33.6 % — ABNORMAL LOW (ref 36.0–46.0)
HCT: 35.1 % — ABNORMAL LOW (ref 36.0–46.0)
Hemoglobin: 11.5 g/dL — ABNORMAL LOW (ref 12.0–15.0)
Hemoglobin: 12 g/dL (ref 12.0–15.0)
Immature Granulocytes: 0 %
Immature Granulocytes: 0 %
Lymphocytes Relative: 26 %
Lymphocytes Relative: 40 %
Lymphs Abs: 2.4 10*3/uL (ref 0.7–4.0)
Lymphs Abs: 2.6 10*3/uL (ref 0.7–4.0)
MCH: 32.5 pg (ref 26.0–34.0)
MCH: 32.2 pg (ref 26.0–34.0)
MCHC: 34.2 g/dL (ref 30.0–36.0)
MCHC: 34.2 g/dL (ref 30.0–36.0)
MCV: 94.9 fL (ref 80.0–100.0)
MCV: 94.1 fL (ref 80.0–100.0)
Monocytes Absolute: 1 10*3/uL (ref 0.1–1.0)
Monocytes Absolute: 0.8 10*3/uL (ref 0.1–1.0)
Monocytes Relative: 11 %
Monocytes Relative: 12 %
Neutro Abs: 5.5 10*3/uL (ref 1.7–7.7)
Neutro Abs: 2.6 10*3/uL (ref 1.7–7.7)
Neutrophils Relative %: 59 %
Neutrophils Relative %: 40 %
Platelets: 212 10*3/uL (ref 150–400)
Platelets: 225 10*3/uL (ref 150–400)
RBC: 3.54 MIL/uL — ABNORMAL LOW (ref 3.87–5.11)
RBC: 3.73 MIL/uL — ABNORMAL LOW (ref 3.87–5.11)
RDW: 13.2 % (ref 11.5–15.5)
RDW: 13.2 % (ref 11.5–15.5)
Smear Review: NORMAL
WBC: 9.4 10*3/uL (ref 4.0–10.5)
WBC: 6.5 10*3/uL (ref 4.0–10.5)
nRBC: 0 % (ref 0.0–0.2)
nRBC: 0 % (ref 0.0–0.2)

## 2023-03-28 LAB — BASIC METABOLIC PANEL
Anion gap: 5 (ref 5–15)
BUN: <5 mg/dL — ABNORMAL LOW (ref 6–20)
CO2: 26 mmol/L (ref 22–32)
Calcium: 7.9 mg/dL — ABNORMAL LOW (ref 8.9–10.3)
Chloride: 109 mmol/L (ref 98–111)
Creatinine, Ser: 0.58 mg/dL (ref 0.44–1.00)
GFR, Estimated: >60 mL/min (ref >60)
Glucose, Bld: 86 mg/dL (ref 70–99)
Potassium: 3.7 mmol/L (ref 3.5–5.1)
Sodium: 140 mmol/L (ref 135–145)

## 2023-03-28 LAB — COMPREHENSIVE METABOLIC PANEL
ALT: 11 U/L (ref 0–44)
AST: 15 U/L (ref 15–41)
Albumin: 3.2 g/dL — ABNORMAL LOW (ref 3.5–5.0)
Alkaline Phosphatase: 51 U/L (ref 38–126)
Anion gap: 8 (ref 5–15)
BUN: <5 mg/dL — ABNORMAL LOW (ref 6–20)
CO2: 26 mmol/L (ref 22–32)
Calcium: 8.3 mg/dL — ABNORMAL LOW (ref 8.9–10.3)
Chloride: 105 mmol/L (ref 98–111)
Creatinine, Ser: 0.62 mg/dL (ref 0.44–1.00)
GFR, Estimated: >60 mL/min (ref >60)
Glucose, Bld: 98 mg/dL (ref 70–99)
Potassium: 3.8 mmol/L (ref 3.5–5.1)
Sodium: 139 mmol/L (ref 135–145)
Total Bilirubin: 0.3 mg/dL (ref 0.3–1.2)
Total Protein: 5.8 g/dL — ABNORMAL LOW (ref 6.5–8.1)

## 2023-03-28 LAB — LACTIC ACID, PLASMA: Lactic Acid, Venous: 1.2 mmol/L (ref 0.5–1.9)

## 2023-03-28 MED ORDER — HYDROMORPHONE HCL 1 MG/ML IJ SOLN
0.5000 mg | INTRAMUSCULAR | Status: DC | PRN
  Administered 2023-03-29 – 2023-03-30 (×3): 0.5 mg via INTRAVENOUS
  Filled 2023-03-28 (×3): qty 0.5

## 2023-03-28 MED ORDER — OXYCODONE HCL 5 MG PO TABS
5.0000 mg | ORAL_TABLET | ORAL | Status: DC | PRN
  Administered 2023-03-28 – 2023-03-30 (×7): 10 mg via ORAL
  Filled 2023-03-28 (×7): qty 2

## 2023-03-28 MED ORDER — IOHEXOL 300 MG/ML  SOLN
100.0000 mL | Freq: Once | INTRAMUSCULAR | Status: AC | PRN
  Administered 2023-03-28: 100 mL via INTRAVENOUS

## 2023-03-28 MED ORDER — FAMOTIDINE 20 MG PO TABS
20.0000 mg | ORAL_TABLET | Freq: Two times a day (BID) | ORAL | Status: DC
  Administered 2023-03-28 – 2023-03-30 (×5): 20 mg via ORAL
  Filled 2023-03-28 (×5): qty 1

## 2023-03-28 MED ORDER — DICYCLOMINE HCL 20 MG PO TABS
20.0000 mg | ORAL_TABLET | Freq: Three times a day (TID) | ORAL | Status: DC
  Administered 2023-03-28 – 2023-03-30 (×6): 20 mg via ORAL
  Filled 2023-03-28 (×11): qty 1

## 2023-03-28 MED ORDER — HYDROMORPHONE HCL 1 MG/ML IJ SOLN: 0.5000 mg | INTRAMUSCULAR | Status: DC | PRN

## 2023-03-28 NOTE — Plan of Care (Signed)

## 2023-03-28 NOTE — Significant Event (Addendum)
Notified by RN that patient had acute severe worsening of abdominal pain.  No appreciable changes in vitals  Plan: Stat CBC, CMP, LA STAT CT abd with IV and PO contrast (if patient cannot tolerate PO then just IV is acceptable) Continue IVF Continue abx  Lolita Patella MD  Addendum: CBC, CMP, lactic acid normal.  Lab workup reassuring.  CT abd/pel pending.  Please contact cross cover if any concerning findings

## 2023-03-28 NOTE — Progress Notes (Signed)
PROGRESS NOTE    Diane Gamble  WGN:[id number] DOB: [birth year] DOA: 03/26/2023 PCP: [facility]    Brief Narrative:  42 y.o. female with medical history significant of anxiety, chronic pelvic pain, dysphagia requiring esophageal dilations, GERD who presents for evaluation of worsening abdominal pain over 2 days.  Patient was traveling with family and ate hibachi.  On weight: 2 days prior to presentation patient experienced acute onset of abdominal pain associated with multiple episodes of diarrhea, nonbloody.   Symptoms did not resolve spontaneously and patient presented to the emergency room.  On presentation patient is hemodynamically stable but in a fairly significant amount of pain.  CT scan done on admission revealed evidence of pancolitis, favor infectious etiology.  Of note patient had a relatively recent endoscopy and colonoscopy on 8/21.  No evidence of inflammation on endoscopy report from that time.  9/5: GI PCR panel positive for Campylobacter and enteropathogenic E. coli. 9/6: Patient wanted to leave yesterday but was in too much pain.  Remains hemodynamically stable with reassuring lab work   Assessment & Plan:   Principal Problem:   Pancolitis (HCC)  Pancolitis Diarrhea Campylobacter infection Enteropathogenic E. coli infection Likely foodborne diarrheal illness resulting in colitis.  Lower suspicion for ischemic or inflammatory colitis at this time.  Patient clinically improving. Plan: Continue IV Rocephin and IV metronidazole.  Continue multimodal pain regimen.  Continue for liquid diet.  Continue isolation precautions.  Attempted minimize narcotic use.  Possible discharge in 1 to 2 days.   Anxiety PTA Xanax as needed   Hyponatremia Now recovered.  Sodium up to 136 Likely related to volume loss from diarrhea IV fluids as above  Headache Responded to Fioricet.  Will continue as needed    DVT prophylaxis: SQ Lovenox Code Status: Full Family  Communication: Spouse at bedside 9/5, 9/6 Disposition Plan: Status is: Inpatient Remains inpatient appropriate because: Infectious diarrhea on IV antibiotics   Level of care: Med-Surg  Consultants:  None  Procedures:  None  Antimicrobials: Rocephin Metronidazole   Subjective: Seen and examined.  Resting in bed.  Still in some pain.  Seems slightly improved.  Objective: Vitals:   03/27/23 1532 03/27/23 1909 03/28/23 0111 03/28/23 0828  BP: 98/60 105/68 (!) 97/57 (!) 134/96  Pulse: 72 61 76 71  Resp: 20 20 20 18   Temp: (!) 97.5 F (36.4 C) 98.4 F (36.9 C) 98.1 F (36.7 C) 98 F (36.7 C)  TempSrc: Oral Oral Oral Oral  SpO2: 100% 99% 100% 100%  Weight:      Height:        Intake/Output Summary (Last 24 hours) at 03/28/2023 1247 Last data filed at 03/28/2023 1000 Gross per 24 hour  Intake 1170.62 ml  Output 0 ml  Net 1170.62 ml   Filed Weights   03/26/23 1005  Weight: 65.8 kg    Examination:  General exam: Appears fatigued.  Uncomfortable due to pain Respiratory system: Lungs clear.  Normal work of breathing.  Room air Cardiovascular system: S1-S2, RRR, no murmurs, no pedal edema Gastrointestinal system: Soft, nondistended, epigastric tenderness, normal bowel sounds Central nervous system: Alert and oriented. No focal neurological deficits. Extremities: Symmetric 5 x 5 power. Skin: No rashes, lesions or ulcers Psychiatry: Judgement and insight appear normal. Mood & affect appropriate.     Data Reviewed: I have personally reviewed following labs and imaging studies  CBC: Recent Labs  Lab 03/26/23 1004 03/27/23 0645 03/28/23 0920  WBC 14.2* 8.8 9.4  NEUTROABS  --   --  5.5  HGB 15.2* 12.0 11.5*  HCT 44.2 33.6* 33.6*  MCV 93.1 92.6 94.9  PLT 269 205 212   Basic Metabolic Panel: Recent Labs  Lab 03/26/23 1004 03/27/23 0645 03/28/23 0920  NA 132* 136 140  K 3.6 3.4* 3.7  CL 103 108 109  CO2 20* 21* 26  GLUCOSE 117* 100* 86  BUN 6 6 <5*   CREATININE 0.66 0.63 0.58  CALCIUM 9.0 7.8* 7.9*   GFR: Estimated Creatinine Clearance: 84 mL/min (by C-G formula based on SCr of 0.58 mg/dL). Liver Function Tests: Recent Labs  Lab 03/26/23 1004  AST 15  ALT 12  ALKPHOS 61  BILITOT 0.6  PROT 7.5  ALBUMIN 4.1   Recent Labs  Lab 03/26/23 1004  LIPASE 27   No results for input(s): "AMMONIA" in the last 168 hours. Coagulation Profile: No results for input(s): "INR", "PROTIME" in the last 168 hours. Cardiac Enzymes: No results for input(s): "CKTOTAL", "CKMB", "CKMBINDEX", "TROPONINI" in the last 168 hours. BNP (last 3 results) No results for input(s): "PROBNP" in the last 8760 hours. HbA1C: No results for input(s): "HGBA1C" in the last 72 hours. CBG: No results for input(s): "GLUCAP" in the last 168 hours. Lipid Profile: No results for input(s): "CHOL", "HDL", "LDLCALC", "TRIG", "CHOLHDL", "LDLDIRECT" in the last 72 hours. Thyroid Function Tests: No results for input(s): "TSH", "T4TOTAL", "FREET4", "T3FREE", "THYROIDAB" in the last 72 hours. Anemia Panel: No results for input(s): "VITAMINB12", "FOLATE", "FERRITIN", "TIBC", "IRON", "RETICCTPCT" in the last 72 hours. Sepsis Labs: No results for input(s): "PROCALCITON", "LATICACIDVEN" in the last 168 hours.  Recent Results (from the past 240 hour(s))  Gastrointestinal Panel by PCR , Stool     Status: Abnormal   Collection Time: 03/26/23  4:56 PM   Specimen: Stool  Result Value Ref Range Status   Campylobacter species DETECTED (A) NOT DETECTED Final    Comment: RESULT CALLED TO, READ BACK BY AND VERIFIED WITH: NOELLE THOMAS 03/26/23 1829 MU    Plesimonas shigelloides NOT DETECTED NOT DETECTED Final   Salmonella species NOT DETECTED NOT DETECTED Final   Yersinia enterocolitica NOT DETECTED NOT DETECTED Final   Vibrio species NOT DETECTED NOT DETECTED Final   Vibrio cholerae NOT DETECTED NOT DETECTED Final   Enteroaggregative E coli (EAEC) NOT DETECTED NOT DETECTED Final    Enteropathogenic E coli (EPEC) DETECTED (A) NOT DETECTED Final    Comment: RESULT CALLED TO, READ BACK BY AND VERIFIED WITH: NOELLE THOMAS 03/26/23 1829 MU    Enterotoxigenic E coli (ETEC) NOT DETECTED NOT DETECTED Final   Shiga like toxin producing E coli (STEC) NOT DETECTED NOT DETECTED Final   Shigella/Enteroinvasive E coli (EIEC) NOT DETECTED NOT DETECTED Final   Cryptosporidium NOT DETECTED NOT DETECTED Final   Cyclospora cayetanensis NOT DETECTED NOT DETECTED Final   Entamoeba histolytica NOT DETECTED NOT DETECTED Final   Giardia lamblia NOT DETECTED NOT DETECTED Final   Adenovirus F40/41 NOT DETECTED NOT DETECTED Final   Astrovirus NOT DETECTED NOT DETECTED Final   Norovirus GI/GII NOT DETECTED NOT DETECTED Final   Rotavirus A NOT DETECTED NOT DETECTED Final   Sapovirus (I, II, IV, and V) NOT DETECTED NOT DETECTED Final    Comment: Performed at [facility], [address] [place], [place] [address]  C Difficile Quick Screen w PCR reflex     Status: None   Collection Time: 03/26/23  4:56 PM   Specimen: Stool  Result Value Ref Range Status   C Diff antigen NEGATIVE NEGATIVE  Final   C Diff toxin NEGATIVE NEGATIVE Final   C Diff interpretation No C. difficile detected.  Final    Comment: Performed at [facility], [address] [place], [place] [address]         Radiology Studies: No results found.      Scheduled Meds:  amphetamine-dextroamphetamine  15 mg Oral BID   dicyclomine  20 mg Oral TID AC & HS   enoxaparin (LOVENOX) injection  40 mg Subcutaneous Q24H   famotidine  20 mg Oral BID   pantoprazole  40 mg Oral BID   psyllium  1 packet Oral Daily   Continuous Infusions:  cefTRIAXone (ROCEPHIN)  IV 2 g (03/28/23 0929)   lactated ringers 125 mL/hr at 03/28/23 0354   metronidazole 500 mg (03/28/23 0516)   promethazine (PHENERGAN) injection (IM or IVPB) 12.5 mg (03/28/23 1148)     LOS: 2 days     Tresa Moore, MD [facility]   If 7PM-7AM, please contact night-coverage  03/28/2023, 12:47 PM

## 2023-03-29 DIAGNOSIS — K5100 Ulcerative (chronic) pancolitis without complications: Secondary | ICD-10-CM | POA: Diagnosis not present

## 2023-03-29 NOTE — Plan of Care (Signed)

## 2023-03-29 NOTE — Progress Notes (Signed)
PROGRESS NOTE    Diane Gamble  VWU:[id number] DOB: 1981-01-06 DOA: 03/26/2023 PCP: [facility]    Brief Narrative:  42 y.o. female with medical history significant of anxiety, chronic pelvic pain, dysphagia requiring esophageal dilations, GERD who presents for evaluation of worsening abdominal pain over 2 days.  Patient was traveling with family and ate hibachi.  On weight: 2 days prior to presentation patient experienced acute onset of abdominal pain associated with multiple episodes of diarrhea, nonbloody.   Symptoms did not resolve spontaneously and patient presented to the emergency room.  On presentation patient is hemodynamically stable but in a fairly significant amount of pain.  CT scan done on admission revealed evidence of pancolitis, favor infectious etiology.  Of note patient had a relatively recent endoscopy and colonoscopy on 8/21.  No evidence of inflammation on endoscopy report from that time.  9/5: GI PCR panel positive for Campylobacter and enteropathogenic E. coli. 9/6: Patient wanted to leave yesterday but was in too much pain.  Remains hemodynamically stable with reassuring lab work 9/7: Lab work and imaging reassuring.  Patient remains with abd pain   Assessment & Plan:   Principal Problem:   Pancolitis (HCC)  Pancolitis Diarrhea Campylobacter infection Enteropathogenic E. coli infection Likely foodborne diarrheal illness resulting in colitis.  Lower suspicion for ischemic or inflammatory colitis at this time.  Patient clinically improving. Plan: Continue IV Rocephin and IV metronidazole.  Continue multimodal pain regimen.  Cautiously advance diet.  Continue isolation precautions.  Attempted minimize narcotic use.  Possible discharge in 1 to 2 days.   Anxiety PTA Xanax as needed   Hyponatremia Now recovered.  Sodium up to 136 Likely related to volume loss from diarrhea Continue IVF as above  Headache Responded to Fioricet.  Will continue  as needed    DVT prophylaxis: SQ Lovenox Code Status: Full Family Communication: Spouse at bedside 9/5, 9/6, 9/7 Disposition Plan: Status is: Inpatient Remains inpatient appropriate because: Infectious diarrhea on IV antibiotics   Level of care: Med-Surg  Consultants:  None  Procedures:  None  Antimicrobials: Rocephin Metronidazole   Subjective: Seen and examined.  Resting in bed.  Spouse at bedside.  Still w abd pain  Objective: Vitals:   03/28/23 1652 03/28/23 1921 03/29/23 0202 03/29/23 0950  BP: 111/72 (!) 135/92 114/73 107/70  Pulse: (!) 55 78 64 61  Resp: 18 20 20 18   Temp: 98.3 F (36.8 C) 98.2 F (36.8 C) 98.5 F (36.9 C) 98.3 F (36.8 C)  TempSrc:  Oral Oral Oral  SpO2: 96% 98% 95% 96%  Weight:      Height:        Intake/Output Summary (Last 24 hours) at 03/29/2023 1638 Last data filed at 03/29/2023 1020 Gross per 24 hour  Intake 2205.4 ml  Output 0 ml  Net 2205.4 ml   Filed Weights   03/26/23 1005  Weight: 65.8 kg    Examination:  General exam: Fatigued.  Umcomfortable Respiratory system: Lungs clear.  Normal work of breathing.  Room air Cardiovascular system: S1-S2, RRR, no murmurs, no pedal edema Gastrointestinal system: Soft, ND, mild TTP, +BS Central nervous system: Alert and oriented. No focal neurological deficits. Extremities: Symmetric 5 x 5 power. Skin: No rashes, lesions or ulcers Psychiatry: Judgement and insight appear normal. Mood & affect appropriate.     Data Reviewed: I have personally reviewed following labs and imaging studies  CBC: Recent Labs  Lab 03/26/23 1004 03/27/23 0645 03/28/23 0920 03/28/23 1832  WBC 14.2*  8.8 9.4 6.5  NEUTROABS  --   --  5.5 2.6  HGB 15.2* 12.0 11.5* 12.0  HCT 44.2 33.6* 33.6* 35.1*  MCV 93.1 92.6 94.9 94.1  PLT 269 205 212 225   Basic Metabolic Panel: Recent Labs  Lab 03/26/23 1004 03/27/23 0645 03/28/23 0920 03/28/23 1832  NA 132* 136 140 139  K 3.6 3.4* 3.7 3.8  CL 103  108 109 105  CO2 20* 21* 26 26  GLUCOSE 117* 100* 86 98  BUN 6 6 <5* <5*  CREATININE 0.66 0.63 0.58 0.62  CALCIUM 9.0 7.8* 7.9* 8.3*   GFR: Estimated Creatinine Clearance: 84 mL/min (by C-G formula based on SCr of 0.62 mg/dL). Liver Function Tests: Recent Labs  Lab 03/26/23 1004 03/28/23 1832  AST 15 15  ALT 12 11  ALKPHOS 61 51  BILITOT 0.6 0.3  PROT 7.5 5.8*  ALBUMIN 4.1 3.2*   Recent Labs  Lab 03/26/23 1004  LIPASE 27   No results for input(s): "AMMONIA" in the last 168 hours. Coagulation Profile: No results for input(s): "INR", "PROTIME" in the last 168 hours. Cardiac Enzymes: No results for input(s): "CKTOTAL", "CKMB", "CKMBINDEX", "TROPONINI" in the last 168 hours. BNP (last 3 results) No results for input(s): "PROBNP" in the last 8760 hours. HbA1C: No results for input(s): "HGBA1C" in the last 72 hours. CBG: No results for input(s): "GLUCAP" in the last 168 hours. Lipid Profile: No results for input(s): "CHOL", "HDL", "LDLCALC", "TRIG", "CHOLHDL", "LDLDIRECT" in the last 72 hours. Thyroid Function Tests: No results for input(s): "TSH", "T4TOTAL", "FREET4", "T3FREE", "THYROIDAB" in the last 72 hours. Anemia Panel: No results for input(s): "VITAMINB12", "FOLATE", "FERRITIN", "TIBC", "IRON", "RETICCTPCT" in the last 72 hours. Sepsis Labs: Recent Labs  Lab 03/28/23 1832  LATICACIDVEN 1.2    Recent Results (from the past 240 hour(s))  Gastrointestinal Panel by PCR , Stool     Status: Abnormal   Collection Time: 03/26/23  4:56 PM   Specimen: Stool  Result Value Ref Range Status   Campylobacter species DETECTED (A) NOT DETECTED Final    Comment: RESULT CALLED TO, READ BACK BY AND VERIFIED WITH: NOELLE THOMAS 03/26/23 1829 MU    Plesimonas shigelloides NOT DETECTED NOT DETECTED Final   Salmonella species NOT DETECTED NOT DETECTED Final   Yersinia enterocolitica NOT DETECTED NOT DETECTED Final   Vibrio species NOT DETECTED NOT DETECTED Final   Vibrio  cholerae NOT DETECTED NOT DETECTED Final   Enteroaggregative E coli (EAEC) NOT DETECTED NOT DETECTED Final   Enteropathogenic E coli (EPEC) DETECTED (A) NOT DETECTED Final    Comment: RESULT CALLED TO, READ BACK BY AND VERIFIED WITH: NOELLE THOMAS 03/26/23 1829 MU    Enterotoxigenic E coli (ETEC) NOT DETECTED NOT DETECTED Final   Shiga like toxin producing E coli (STEC) NOT DETECTED NOT DETECTED Final   Shigella/Enteroinvasive E coli (EIEC) NOT DETECTED NOT DETECTED Final   Cryptosporidium NOT DETECTED NOT DETECTED Final   Cyclospora cayetanensis NOT DETECTED NOT DETECTED Final   Entamoeba histolytica NOT DETECTED NOT DETECTED Final   Giardia lamblia NOT DETECTED NOT DETECTED Final   Adenovirus F40/41 NOT DETECTED NOT DETECTED Final   Astrovirus NOT DETECTED NOT DETECTED Final   Norovirus GI/GII NOT DETECTED NOT DETECTED Final   Rotavirus A NOT DETECTED NOT DETECTED Final   Sapovirus (I, II, IV, and V) NOT DETECTED NOT DETECTED Final    Comment: Performed at [facility], [address] [place], [place] [address]  C Difficile Quick Screen  w PCR reflex     Status: None   Collection Time: 03/26/23  4:56 PM   Specimen: Stool  Result Value Ref Range Status   C Diff antigen NEGATIVE NEGATIVE Final   C Diff toxin NEGATIVE NEGATIVE Final   C Diff interpretation No C. difficile detected.  Final    Comment: Performed at [facility], [address] [place], [place] [address]         Radiology Studies: CT ABDOMEN PELVIS W CONTRAST  Result Date: 03/28/2023 CLINICAL DATA:  Acute abdominal pain EXAM: CT ABDOMEN AND PELVIS WITH CONTRAST TECHNIQUE: Multidetector CT imaging of the abdomen and pelvis was performed using the standard protocol following bolus administration of intravenous contrast. RADIATION DOSE REDUCTION: This exam was performed according to the departmental dose-optimization program which includes automated exposure control, adjustment of the mA and/or kV  according to patient size and/or use of iterative reconstruction technique. CONTRAST:  OMNIPAQUE IOHEXOL 300 MG/ML  SOLN COMPARISON:  CT abdomen and pelvis 03/26/2023 FINDINGS: Lower chest: There are small bilateral pleural effusions. There is atelectasis in the bilateral lung bases with a small amount of patchy airspace disease in the left lower lobe and right middle lobe. Hepatobiliary: No focal liver abnormality is seen. No gallstones, gallbladder wall thickening, or biliary dilatation. Pancreas: Unremarkable. No pancreatic ductal dilatation or surrounding inflammatory changes. Spleen: Normal in size without focal abnormality. Adrenals/Urinary Tract: There are punctate nonobstructing bilateral renal calculi. Otherwise, the adrenal glands, kidneys and bladder are within normal limits. Stomach/Bowel: There is wall thickening and inflammation of the cecum/proximal ascending colon which appears mild and decreased compared to prior study. Terminal ileum appears within normal limits. The appendix is normal. There is no bowel obstruction, pneumatosis or free air. Small bowel and stomach are within normal limits. Vascular/Lymphatic: No significant vascular findings are present. No enlarged abdominal or pelvic lymph nodes. Reproductive: There is an IUD in the uterus. There is a dominant follicle or small cyst in the left ovary measuring 16 mm, unchanged. Other: There is a small amount of free fluid in the pelvis which is new from prior. Musculoskeletal: No acute or significant osseous findings. Bilateral breast implants are noted. IMPRESSION: 1. Wall thickening and inflammation of the cecum/proximal ascending colon compatible with colitis. This is decreased compared to the prior study. 2. New small amount of free fluid in the pelvis. 3. Small bilateral pleural effusions. 4. Patchy airspace disease in the left lower lobe and right middle lobe worrisome for infection. 5. Nonobstructing bilateral renal calculi. 6. 16  mm dominant follicle or small cyst in the left ovary. No follow-up imaging recommended. Electronically Signed   By: Darliss Cheney M.D.   On: 03/28/2023 22:19        Scheduled Meds:  amphetamine-dextroamphetamine  15 mg Oral BID   dicyclomine  20 mg Oral TID AC & HS   enoxaparin (LOVENOX) injection  40 mg Subcutaneous Q24H   famotidine  20 mg Oral BID   pantoprazole  40 mg Oral BID   psyllium  1 packet Oral Daily   Continuous Infusions:  cefTRIAXone (ROCEPHIN)  IV 2 g (03/29/23 0955)   lactated ringers Stopped (03/28/23 2117)   metronidazole 500 mg (03/29/23 1529)   promethazine (PHENERGAN) injection (IM or IVPB) Stopped (03/28/23 1203)     LOS: 3 days     Tresa Moore, MD [facility]   If 7PM-7AM, please contact night-coverage  03/29/2023, 4:38 PM

## 2023-03-30 DIAGNOSIS — K5100 Ulcerative (chronic) pancolitis without complications: Secondary | ICD-10-CM | POA: Diagnosis not present

## 2023-03-30 MED ORDER — CIPROFLOXACIN HCL 500 MG PO TABS: 500.0000 mg | ORAL_TABLET | Freq: Two times a day (BID) | ORAL | Status: AC

## 2023-03-30 MED ORDER — FLUCONAZOLE 50 MG PO TABS
ORAL_TABLET | ORAL | 0 refills | Status: AC
Start: 2023-03-30 — End: 2023-04-02

## 2023-03-30 MED ORDER — PROMETHAZINE HCL 12.5 MG PO TABS: 12.5000 mg | ORAL_TABLET | Freq: Four times a day (QID) | ORAL | 0 refills | Status: AC | PRN

## 2023-03-30 MED ORDER — METRONIDAZOLE 500 MG PO TABS: 500.0000 mg | ORAL_TABLET | Freq: Three times a day (TID) | ORAL | Status: AC

## 2023-03-30 MED ORDER — OXYCODONE-ACETAMINOPHEN 10-325 MG PO TABS
0.5000 | ORAL_TABLET | ORAL | 0 refills | Status: DC | PRN
Start: 2023-03-30 — End: 2024-06-11

## 2023-03-30 NOTE — Plan of Care (Signed)

## 2023-03-30 NOTE — Discharge Summary (Signed)
Physician Discharge Summary  ANNEBELLE FICHTER FAO:[id number] DOB: [birth year] DOA: 03/26/2023  PCP: [facility]  Admit date: 03/26/2023 Discharge date: 03/30/2023  Admitted From: Home Disposition:  Home  Recommendations for Outpatient Follow-up:  Follow up with PCP in 1-2 weeks   Home Health:No Equipment/Devices:None   Discharge Condition:Stable  CODE STATUS:FULL  Diet recommendation: Soft/bland  Brief/Interim Summary:  42 y.o. female with medical history significant of anxiety, chronic pelvic pain, dysphagia requiring esophageal dilations, GERD who presents for evaluation of worsening abdominal pain over 2 days.  Patient was traveling with family and ate hibachi.  On weight: 2 days prior to presentation patient experienced acute onset of abdominal pain associated with multiple episodes of diarrhea, nonbloody.   Symptoms did not resolve spontaneously and patient presented to the emergency room.  On presentation patient is hemodynamically stable but in a fairly significant amount of pain.  CT scan done on admission revealed evidence of pancolitis, favor infectious etiology.  Of note patient had a relatively recent endoscopy and colonoscopy on 8/21.  No evidence of inflammation on endoscopy report from that time.   9/5: GI PCR panel positive for Campylobacter and enteropathogenic E. coli. 9/6: Patient wanted to leave yesterday but was in too much pain.  Remains hemodynamically stable with reassuring lab work 9/7: Lab work and imaging reassuring.  Patient remains with abd pain 9/8: Abd pain improved, labs reassuring.  Patient still with some nausea but stable for dc home.  Complete course of abx on 9/11    Discharge Diagnoses:  Principal Problem:   Pancolitis (HCC)   Pancolitis Diarrhea Campylobacter infection Enteropathogenic E. coli infection Likely foodborne diarrheal illness resulting in colitis.  Lower suspicion for ischemic or inflammatory colitis at this time.   Patient clinically improving. Plan: Completed 5 days of IV abx in hospital Additional 2 days prescribed Rec soft diet, adequate hydration, bowel rest PRN phenergan and narcotics for nausea/pain   Discharge Instructions  Discharge Instructions     Diet - low sodium heart healthy   Complete by: As directed    Diet - low sodium heart healthy   Complete by: As directed    Increase activity slowly   Complete by: As directed    Increase activity slowly   Complete by: As directed       Allergies as of 03/30/2023       Reactions   Doxycycline Nausea And Vomiting        Medication List     STOP taking these medications    fluticasone 110 MCG/ACT inhaler Commonly known as: Flovent HFA   methocarbamol 500 MG tablet Commonly known as: Robaxin       TAKE these medications    albuterol 108 (90 Base) MCG/ACT inhaler Commonly known as: VENTOLIN HFA Inhale 1-2 puffs into the lungs every 6 (six) hours as needed for wheezing or shortness of breath.   ALPRAZolam 0.5 MG tablet Commonly known as: XANAX Take 0.5 mg by mouth.   amphetamine-dextroamphetamine 15 MG tablet Commonly known as: ADDERALL Take 1 tablet by mouth 2 (two) times daily.   ciprofloxacin 500 MG tablet Commonly known as: Cipro Take 1 tablet (500 mg total) by mouth 2 (two) times daily for 3 days.   CRANBERRY PO Take by mouth as needed.   dicyclomine 20 MG tablet Commonly known as: BENTYL Take 1 tablet (20 mg total) by mouth 3 (three) times daily before meals for 5 days.   famotidine 20 MG tablet Commonly known as: PEPCID Take  1 tablet (20 mg total) by mouth 2 (two) times daily for 5 days.   fluconazole 50 MG tablet Commonly known as: Diflucan Take 3 tablets (150 mg total) by mouth daily for 1 day, THEN 2 tablets (100 mg total) daily for 2 days. Start taking on: March 30, 2023   fluticasone 50 MCG/ACT nasal spray Commonly known as: FLONASE Place 1 spray into both nostrils 2 (two) times  daily.   levonorgestrel 20 MCG/DAY Iud Commonly known as: MIRENA 1 each by Intrauterine route once.   metroNIDAZOLE 500 MG tablet Commonly known as: Flagyl Take 1 tablet (500 mg total) by mouth 3 (three) times daily for 3 days.   oxyCODONE-acetaminophen 10-325 MG tablet Commonly known as: Percocet Take 0.5 tablets by mouth every 4 (four) hours as needed for pain.   pantoprazole 40 MG tablet Commonly known as: Protonix Take 1 tablet (40 mg total) by mouth daily.   promethazine 12.5 MG tablet Commonly known as: PHENERGAN Take 1 tablet (12.5 mg total) by mouth every 6 (six) hours as needed for nausea or vomiting.   promethazine-dextromethorphan 6.25-15 MG/5ML syrup Commonly known as: PROMETHAZINE-DM Take 5 mLs by mouth 4 (four) times daily as needed.   psyllium 0.52 g capsule Commonly known as: REGULOID Take 0.52 g by mouth daily.        Follow-up Information     [facility] In 1 week.   Specialty: Internal Medicine Contact information: [address] [place] [address] [phone]                Allergies  Allergen Reactions   Doxycycline Nausea And Vomiting    Consultations: None   Procedures/Studies: CT ABDOMEN PELVIS W CONTRAST  Result Date: 03/28/2023 CLINICAL DATA:  Acute abdominal pain EXAM: CT ABDOMEN AND PELVIS WITH CONTRAST TECHNIQUE: Multidetector CT imaging of the abdomen and pelvis was performed using the standard protocol following bolus administration of intravenous contrast. RADIATION DOSE REDUCTION: This exam was performed according to the departmental dose-optimization program which includes automated exposure control, adjustment of the mA and/or kV according to patient size and/or use of iterative reconstruction technique. CONTRAST:  OMNIPAQUE IOHEXOL 300 MG/ML  SOLN COMPARISON:  CT abdomen and pelvis 03/26/2023 FINDINGS: Lower chest: There are small bilateral pleural effusions. There is atelectasis in  the bilateral lung bases with a small amount of patchy airspace disease in the left lower lobe and right middle lobe. Hepatobiliary: No focal liver abnormality is seen. No gallstones, gallbladder wall thickening, or biliary dilatation. Pancreas: Unremarkable. No pancreatic ductal dilatation or surrounding inflammatory changes. Spleen: Normal in size without focal abnormality. Adrenals/Urinary Tract: There are punctate nonobstructing bilateral renal calculi. Otherwise, the adrenal glands, kidneys and bladder are within normal limits. Stomach/Bowel: There is wall thickening and inflammation of the cecum/proximal ascending colon which appears mild and decreased compared to prior study. Terminal ileum appears within normal limits. The appendix is normal. There is no bowel obstruction, pneumatosis or free air. Small bowel and stomach are within normal limits. Vascular/Lymphatic: No significant vascular findings are present. No enlarged abdominal or pelvic lymph nodes. Reproductive: There is an IUD in the uterus. There is a dominant follicle or small cyst in the left ovary measuring 16 mm, unchanged. Other: There is a small amount of free fluid in the pelvis which is new from prior. Musculoskeletal: No acute or significant osseous findings. Bilateral breast implants are noted. IMPRESSION: 1. Wall thickening and inflammation of the cecum/proximal ascending colon compatible with colitis.  This is decreased compared to the prior study. 2. New small amount of free fluid in the pelvis. 3. Small bilateral pleural effusions. 4. Patchy airspace disease in the left lower lobe and right middle lobe worrisome for infection. 5. Nonobstructing bilateral renal calculi. 6. 16 mm dominant follicle or small cyst in the left ovary. No follow-up imaging recommended. Electronically Signed   By: Darliss Cheney M.D.   On: 03/28/2023 22:19   CT ABDOMEN PELVIS W CONTRAST  Result Date: 03/26/2023 CLINICAL DATA:  Abdominal pain and diarrhea for 3  days. EXAM: CT ABDOMEN AND PELVIS WITH CONTRAST TECHNIQUE: Multidetector CT imaging of the abdomen and pelvis was performed using the standard protocol following bolus administration of intravenous contrast. RADIATION DOSE REDUCTION: This exam was performed according to the departmental dose-optimization program which includes automated exposure control, adjustment of the mA and/or kV according to patient size and/or use of iterative reconstruction technique. CONTRAST:  OMNIPAQUE IOHEXOL 300 MG/ML  SOLN COMPARISON:  01/22/2023 FINDINGS: Lower Chest: No acute findings. Hepatobiliary: No suspicious hepatic masses identified. Stable tiny sub-cm cyst in left hepatic lobe. Gallbladder is unremarkable. No evidence of biliary ductal dilatation. Pancreas:  No mass or inflammatory changes. Spleen: Within normal limits in size and appearance. Adrenals/Urinary Tract: No suspicious masses identified. 3 mm calculus again seen in lower pole of right kidney. No evidence of ureteral calculi or hydronephrosis. Stomach/Bowel: Mild wall thickening is seen throughout the ascending, transverse and descending colon, and involving the terminal ileum. This is consistent with enterocolitis. No evidence of perforation or abscess. Normal appendix visualized. Vascular/Lymphatic: No pathologically enlarged lymph nodes. No acute vascular findings. Reproductive: IUD seen in expected position. No mass or other significant abnormality. Other:  None. Musculoskeletal:  No suspicious bone lesions identified. IMPRESSION: Enterocolitis involving the terminal ileum and ascending, transverse, and descending colon. No evidence of perforation or abscess. This favors an infectious etiology, with Crohn disease or ulcerative colitis considered less likely. Stable tiny nonobstructing right renal calculus. Electronically Signed   By: Danae Orleans M.D.   On: 03/26/2023 13:40      Subjective: Seen and examined on day of dc.  Stable, still abd pain and  nausea but improved.  Stable for dc home.  Discharge Exam: Vitals:   03/30/23 0344 03/30/23 0850  BP: 119/76 121/86  Pulse: 65 60  Resp: 16 18  Temp: 98.5 F (36.9 C) 98.1 F (36.7 C)  SpO2: 95% 99%   Vitals:   03/29/23 1647 03/29/23 1913 03/30/23 0344 03/30/23 0850  BP: 104/69 108/72 119/76 121/86  Pulse: 68 65 65 60  Resp: 18 20 16 18   Temp: 98.3 F (36.8 C) 98 F (36.7 C) 98.5 F (36.9 C) 98.1 F (36.7 C)  TempSrc: Oral Oral Oral Oral  SpO2: 99% 100% 95% 99%  Weight:      Height:        General: Pt is alert, awake, not in acute distress Cardiovascular: RRR, S1/S2 +, no rubs, no gallops Respiratory: CTA bilaterally, no wheezing, no rhonchi Abdominal: Soft, NT, ND, bowel sounds + Extremities: no edema, no cyanosis    The results of significant diagnostics from this hospitalization (including imaging, microbiology, ancillary and laboratory) are listed below for reference.     Microbiology: Recent Results (from the past 240 hour(s))  Gastrointestinal Panel by PCR , Stool     Status: Abnormal   Collection Time: 03/26/23  4:56 PM   Specimen: Stool  Result Value Ref Range Status   Campylobacter species DETECTED (  A) NOT DETECTED Final    Comment: RESULT CALLED TO, READ BACK BY AND VERIFIED WITH: NOELLE THOMAS 03/26/23 1829 MU    Plesimonas shigelloides NOT DETECTED NOT DETECTED Final   Salmonella species NOT DETECTED NOT DETECTED Final   Yersinia enterocolitica NOT DETECTED NOT DETECTED Final   Vibrio species NOT DETECTED NOT DETECTED Final   Vibrio cholerae NOT DETECTED NOT DETECTED Final   Enteroaggregative E coli (EAEC) NOT DETECTED NOT DETECTED Final   Enteropathogenic E coli (EPEC) DETECTED (A) NOT DETECTED Final    Comment: RESULT CALLED TO, READ BACK BY AND VERIFIED WITH: NOELLE THOMAS 03/26/23 1829 MU    Enterotoxigenic E coli (ETEC) NOT DETECTED NOT DETECTED Final   Shiga like toxin producing E coli (STEC) NOT DETECTED NOT DETECTED Final    Shigella/Enteroinvasive E coli (EIEC) NOT DETECTED NOT DETECTED Final   Cryptosporidium NOT DETECTED NOT DETECTED Final   Cyclospora cayetanensis NOT DETECTED NOT DETECTED Final   Entamoeba histolytica NOT DETECTED NOT DETECTED Final   Giardia lamblia NOT DETECTED NOT DETECTED Final   Adenovirus F40/41 NOT DETECTED NOT DETECTED Final   Astrovirus NOT DETECTED NOT DETECTED Final   Norovirus GI/GII NOT DETECTED NOT DETECTED Final   Rotavirus A NOT DETECTED NOT DETECTED Final   Sapovirus (I, II, IV, and V) NOT DETECTED NOT DETECTED Final    Comment: Performed at [facility], [address] [place], [place] [address]  C Difficile Quick Screen w PCR reflex     Status: None   Collection Time: 03/26/23  4:56 PM   Specimen: Stool  Result Value Ref Range Status   C Diff antigen NEGATIVE NEGATIVE Final   C Diff toxin NEGATIVE NEGATIVE Final   C Diff interpretation No C. difficile detected.  Final    Comment: Performed at [facility], [address] [place], [place] [address]     Labs: BNP (last 3 results) No results for input(s): "BNP" in the last 8760 hours. Basic Metabolic Panel: Recent Labs  Lab 03/26/23 1004 03/27/23 0645 03/28/23 0920 03/28/23 1832  NA 132* 136 140 139  K 3.6 3.4* 3.7 3.8  CL 103 108 109 105  CO2 20* 21* 26 26  GLUCOSE 117* 100* 86 98  BUN 6 6 <5* <5*  CREATININE 0.66 0.63 0.58 0.62  CALCIUM 9.0 7.8* 7.9* 8.3*   Liver Function Tests: Recent Labs  Lab 03/26/23 1004 03/28/23 1832  AST 15 15  ALT 12 11  ALKPHOS 61 51  BILITOT 0.6 0.3  PROT 7.5 5.8*  ALBUMIN 4.1 3.2*   Recent Labs  Lab 03/26/23 1004  LIPASE 27   No results for input(s): "AMMONIA" in the last 168 hours. CBC: Recent Labs  Lab 03/26/23 1004 03/27/23 0645 03/28/23 0920 03/28/23 1832  WBC 14.2* 8.8 9.4 6.5  NEUTROABS  --   --  5.5 2.6  HGB 15.2* 12.0 11.5* 12.0  HCT 44.2 33.6* 33.6* 35.1*  MCV 93.1 92.6 94.9 94.1  PLT 269 205 212 225   Cardiac  Enzymes: No results for input(s): "CKTOTAL", "CKMB", "CKMBINDEX", "TROPONINI" in the last 168 hours. BNP: Invalid input(s): "POCBNP" CBG: No results for input(s): "GLUCAP" in the last 168 hours. D-Dimer No results for input(s): "DDIMER" in the last 72 hours. Hgb A1c No results for input(s): "HGBA1C" in the last 72 hours. Lipid Profile No results for input(s): "CHOL", "HDL", "LDLCALC", "TRIG", "CHOLHDL", "LDLDIRECT" in the last 72 hours. Thyroid function studies No results for input(s): "TSH", "T4TOTAL", "T3FREE", "THYROIDAB" in  the last 72 hours.  Invalid input(s): "FREET3" Anemia work up No results for input(s): "VITAMINB12", "FOLATE", "FERRITIN", "TIBC", "IRON", "RETICCTPCT" in the last 72 hours. Urinalysis    Component Value Date/Time   COLORURINE YELLOW (A) 03/26/2023 1056   APPEARANCEUR HAZY (A) 03/26/2023 1056   APPEARANCEUR Clear 02/09/2020 1400   LABSPEC 1.016 03/26/2023 1056   PHURINE 6.0 03/26/2023 1056   GLUCOSEU NEGATIVE 03/26/2023 1056   HGBUR MODERATE (A) 03/26/2023 1056   BILIRUBINUR NEGATIVE 03/26/2023 1056   BILIRUBINUR Negative 02/09/2020 1400   KETONESUR 20 (A) 03/26/2023 1056   PROTEINUR NEGATIVE 03/26/2023 1056   UROBILINOGEN 1.0 11/30/2007 1426   NITRITE NEGATIVE 03/26/2023 1056   LEUKOCYTESUR NEGATIVE 03/26/2023 1056   Sepsis Labs Recent Labs  Lab 03/26/23 1004 03/27/23 0645 03/28/23 0920 03/28/23 1832  WBC 14.2* 8.8 9.4 6.5   Microbiology Recent Results (from the past 240 hour(s))  Gastrointestinal Panel by PCR , Stool     Status: Abnormal   Collection Time: 03/26/23  4:56 PM   Specimen: Stool  Result Value Ref Range Status   Campylobacter species DETECTED (A) NOT DETECTED Final    Comment: RESULT CALLED TO, READ BACK BY AND VERIFIED WITH: NOELLE THOMAS 03/26/23 1829 MU    Plesimonas shigelloides NOT DETECTED NOT DETECTED Final   Salmonella species NOT DETECTED NOT DETECTED Final   Yersinia enterocolitica NOT DETECTED NOT DETECTED Final    Vibrio species NOT DETECTED NOT DETECTED Final   Vibrio cholerae NOT DETECTED NOT DETECTED Final   Enteroaggregative E coli (EAEC) NOT DETECTED NOT DETECTED Final   Enteropathogenic E coli (EPEC) DETECTED (A) NOT DETECTED Final    Comment: RESULT CALLED TO, READ BACK BY AND VERIFIED WITH: NOELLE THOMAS 03/26/23 1829 MU    Enterotoxigenic E coli (ETEC) NOT DETECTED NOT DETECTED Final   Shiga like toxin producing E coli (STEC) NOT DETECTED NOT DETECTED Final   Shigella/Enteroinvasive E coli (EIEC) NOT DETECTED NOT DETECTED Final   Cryptosporidium NOT DETECTED NOT DETECTED Final   Cyclospora cayetanensis NOT DETECTED NOT DETECTED Final   Entamoeba histolytica NOT DETECTED NOT DETECTED Final   Giardia lamblia NOT DETECTED NOT DETECTED Final   Adenovirus F40/41 NOT DETECTED NOT DETECTED Final   Astrovirus NOT DETECTED NOT DETECTED Final   Norovirus GI/GII NOT DETECTED NOT DETECTED Final   Rotavirus A NOT DETECTED NOT DETECTED Final   Sapovirus (I, II, IV, and V) NOT DETECTED NOT DETECTED Final    Comment: Performed at [facility], [address] [place], [place] [address]  C Difficile Quick Screen w PCR reflex     Status: None   Collection Time: 03/26/23  4:56 PM   Specimen: Stool  Result Value Ref Range Status   C Diff antigen NEGATIVE NEGATIVE Final   C Diff toxin NEGATIVE NEGATIVE Final   C Diff interpretation No C. difficile detected.  Final    Comment: Performed at [facility], [address], [place], [place] [address]     Time coordinating discharge: Over 30 minutes  SIGNED:   Tresa Moore, MD  [facility] 03/30/2023, 4:14 PM Pager   If 7PM-7AM, please contact night-coverage

## 2023-03-30 NOTE — Progress Notes (Signed)
Patient was discharged with husband present, patient was given all discharge information with teach back method used to confirm understanding. Patients IV was removed and tolerated well. All personal belongings were taken and the patient was taken out via wheelchair for discharge.

## 2023-03-31 NOTE — Group Note (Deleted)

## 2023-04-03 DIAGNOSIS — F988 Other specified behavioral and emotional disorders with onset usually occurring in childhood and adolescence: Secondary | ICD-10-CM | POA: Diagnosis not present

## 2023-04-03 DIAGNOSIS — F419 Anxiety disorder, unspecified: Secondary | ICD-10-CM | POA: Diagnosis not present

## 2023-04-03 DIAGNOSIS — K5100 Ulcerative (chronic) pancolitis without complications: Secondary | ICD-10-CM | POA: Diagnosis not present

## 2023-04-03 DIAGNOSIS — R109 Unspecified abdominal pain: Secondary | ICD-10-CM | POA: Diagnosis not present

## 2023-06-03 DIAGNOSIS — Z6824 Body mass index (BMI) 24.0-24.9, adult: Secondary | ICD-10-CM | POA: Diagnosis not present

## 2023-06-03 DIAGNOSIS — F988 Other specified behavioral and emotional disorders with onset usually occurring in childhood and adolescence: Secondary | ICD-10-CM | POA: Diagnosis not present

## 2023-06-03 DIAGNOSIS — F419 Anxiety disorder, unspecified: Secondary | ICD-10-CM | POA: Diagnosis not present

## 2023-06-03 DIAGNOSIS — M545 Low back pain, unspecified: Secondary | ICD-10-CM | POA: Diagnosis not present

## 2023-07-25 DIAGNOSIS — Z79899 Other long term (current) drug therapy: Secondary | ICD-10-CM | POA: Diagnosis not present

## 2023-07-25 DIAGNOSIS — F988 Other specified behavioral and emotional disorders with onset usually occurring in childhood and adolescence: Secondary | ICD-10-CM | POA: Diagnosis not present

## 2023-07-25 DIAGNOSIS — E785 Hyperlipidemia, unspecified: Secondary | ICD-10-CM | POA: Diagnosis not present

## 2023-07-25 DIAGNOSIS — D519 Vitamin B12 deficiency anemia, unspecified: Secondary | ICD-10-CM | POA: Diagnosis not present

## 2023-07-25 DIAGNOSIS — F419 Anxiety disorder, unspecified: Secondary | ICD-10-CM | POA: Diagnosis not present

## 2023-08-15 DIAGNOSIS — M533 Sacrococcygeal disorders, not elsewhere classified: Secondary | ICD-10-CM | POA: Diagnosis not present

## 2023-08-15 DIAGNOSIS — M545 Low back pain, unspecified: Secondary | ICD-10-CM | POA: Diagnosis not present

## 2023-09-19 DIAGNOSIS — F988 Other specified behavioral and emotional disorders with onset usually occurring in childhood and adolescence: Secondary | ICD-10-CM | POA: Diagnosis not present

## 2023-09-19 DIAGNOSIS — F419 Anxiety disorder, unspecified: Secondary | ICD-10-CM | POA: Diagnosis not present

## 2023-09-19 DIAGNOSIS — E785 Hyperlipidemia, unspecified: Secondary | ICD-10-CM | POA: Diagnosis not present

## 2023-09-19 DIAGNOSIS — J3089 Other allergic rhinitis: Secondary | ICD-10-CM | POA: Diagnosis not present

## 2023-11-11 DIAGNOSIS — R0981 Nasal congestion: Secondary | ICD-10-CM | POA: Diagnosis not present

## 2023-11-11 DIAGNOSIS — J343 Hypertrophy of nasal turbinates: Secondary | ICD-10-CM | POA: Diagnosis not present

## 2023-11-11 DIAGNOSIS — J3089 Other allergic rhinitis: Secondary | ICD-10-CM | POA: Diagnosis not present

## 2023-11-21 DIAGNOSIS — E785 Hyperlipidemia, unspecified: Secondary | ICD-10-CM | POA: Diagnosis not present

## 2023-11-21 DIAGNOSIS — D519 Vitamin B12 deficiency anemia, unspecified: Secondary | ICD-10-CM | POA: Diagnosis not present

## 2023-11-21 DIAGNOSIS — Z79899 Other long term (current) drug therapy: Secondary | ICD-10-CM | POA: Diagnosis not present

## 2023-11-21 DIAGNOSIS — F419 Anxiety disorder, unspecified: Secondary | ICD-10-CM | POA: Diagnosis not present

## 2023-11-21 DIAGNOSIS — F988 Other specified behavioral and emotional disorders with onset usually occurring in childhood and adolescence: Secondary | ICD-10-CM | POA: Diagnosis not present

## 2023-12-10 DIAGNOSIS — J3089 Other allergic rhinitis: Secondary | ICD-10-CM | POA: Diagnosis not present

## 2023-12-10 DIAGNOSIS — J358 Other chronic diseases of tonsils and adenoids: Secondary | ICD-10-CM | POA: Diagnosis not present

## 2023-12-10 DIAGNOSIS — Z881 Allergy status to other antibiotic agents status: Secondary | ICD-10-CM | POA: Diagnosis not present

## 2023-12-10 DIAGNOSIS — F17210 Nicotine dependence, cigarettes, uncomplicated: Secondary | ICD-10-CM | POA: Diagnosis not present

## 2023-12-10 DIAGNOSIS — J342 Deviated nasal septum: Secondary | ICD-10-CM | POA: Diagnosis not present

## 2023-12-10 DIAGNOSIS — K219 Gastro-esophageal reflux disease without esophagitis: Secondary | ICD-10-CM | POA: Diagnosis not present

## 2023-12-10 DIAGNOSIS — J343 Hypertrophy of nasal turbinates: Secondary | ICD-10-CM | POA: Diagnosis not present

## 2023-12-11 DIAGNOSIS — Z9889 Other specified postprocedural states: Secondary | ICD-10-CM | POA: Diagnosis not present

## 2023-12-16 DIAGNOSIS — K219 Gastro-esophageal reflux disease without esophagitis: Secondary | ICD-10-CM | POA: Diagnosis not present

## 2023-12-16 DIAGNOSIS — Z9889 Other specified postprocedural states: Secondary | ICD-10-CM | POA: Diagnosis not present

## 2023-12-16 DIAGNOSIS — J3489 Other specified disorders of nose and nasal sinuses: Secondary | ICD-10-CM | POA: Diagnosis not present

## 2023-12-16 DIAGNOSIS — Z6824 Body mass index (BMI) 24.0-24.9, adult: Secondary | ICD-10-CM | POA: Diagnosis not present

## 2023-12-16 DIAGNOSIS — B3731 Acute candidiasis of vulva and vagina: Secondary | ICD-10-CM | POA: Diagnosis not present

## 2024-01-16 DIAGNOSIS — F988 Other specified behavioral and emotional disorders with onset usually occurring in childhood and adolescence: Secondary | ICD-10-CM | POA: Diagnosis not present

## 2024-01-16 DIAGNOSIS — Z6823 Body mass index (BMI) 23.0-23.9, adult: Secondary | ICD-10-CM | POA: Diagnosis not present

## 2024-01-16 DIAGNOSIS — F419 Anxiety disorder, unspecified: Secondary | ICD-10-CM | POA: Diagnosis not present

## 2024-01-16 DIAGNOSIS — K219 Gastro-esophageal reflux disease without esophagitis: Secondary | ICD-10-CM | POA: Diagnosis not present

## 2024-03-17 DIAGNOSIS — Z6823 Body mass index (BMI) 23.0-23.9, adult: Secondary | ICD-10-CM | POA: Diagnosis not present

## 2024-03-17 DIAGNOSIS — F988 Other specified behavioral and emotional disorders with onset usually occurring in childhood and adolescence: Secondary | ICD-10-CM | POA: Diagnosis not present

## 2024-03-17 DIAGNOSIS — F419 Anxiety disorder, unspecified: Secondary | ICD-10-CM | POA: Diagnosis not present

## 2024-03-17 DIAGNOSIS — K219 Gastro-esophageal reflux disease without esophagitis: Secondary | ICD-10-CM | POA: Diagnosis not present

## 2024-05-21 DIAGNOSIS — F419 Anxiety disorder, unspecified: Secondary | ICD-10-CM | POA: Diagnosis not present

## 2024-05-21 DIAGNOSIS — F988 Other specified behavioral and emotional disorders with onset usually occurring in childhood and adolescence: Secondary | ICD-10-CM | POA: Diagnosis not present

## 2024-05-21 DIAGNOSIS — B3731 Acute candidiasis of vulva and vagina: Secondary | ICD-10-CM | POA: Diagnosis not present

## 2024-06-11 ENCOUNTER — Ambulatory Visit
Admission: EM | Admit: 2024-06-11 | Discharge: 2024-06-11 | Disposition: A | Discharge status: Home / Self Care | Attending: Nurse Practitioner | Admitting: Nurse Practitioner

## 2024-06-11 ENCOUNTER — Telehealth: Payer: Self-pay | Admitting: Emergency Medicine

## 2024-06-11 ENCOUNTER — Ambulatory Visit (INDEPENDENT_AMBULATORY_CARE_PROVIDER_SITE_OTHER)

## 2024-06-11 ENCOUNTER — Ambulatory Visit: Payer: Self-pay | Admitting: Nurse Practitioner

## 2024-06-11 DIAGNOSIS — M25532 Pain in left wrist: Secondary | ICD-10-CM

## 2024-06-11 DIAGNOSIS — Z043 Encounter for examination and observation following other accident: Secondary | ICD-10-CM | POA: Diagnosis not present

## 2024-06-11 MED ORDER — TRAMADOL HCL 50 MG PO TABS: 50.0000 mg | ORAL_TABLET | Freq: Two times a day (BID) | ORAL | 0 refills | Status: AC | PRN

## 2024-06-11 NOTE — ED Triage Notes (Signed)
 Pt reports she fell and injured her left thumb radiating up to her forearm x 3 days   Took tylenol , meloxicam, and ibuprofen

## 2024-06-11 NOTE — Discharge Instructions (Signed)
 I will contact you later today if the x-ray shows any broken bones.  In the meantime, continue to wear the splint that has been provided to you.  Continue Tylenol  500 to 1000 mg every 6 hours as needed.  I would recommend avoiding NSAIDs given your medical history.  You can take the tramadol  every 12 hours as needed for pain.  Follow-up with orthopedist if pain worsens or does not improve with treatment.

## 2024-06-11 NOTE — ED Notes (Signed)
 Pt had mixed views on taking the tramadol  script. I informed pt that she has full autonomy to decide if she wants to take the pain med. States tramadol  makes her dizzy and nauseas. Pt stated she wanted the script by discharge.

## 2024-06-11 NOTE — ED Provider Notes (Signed)
 [facility]    CSN: [id number] Arrival date & time: 06/11/24  1148      History   Chief Complaint No chief complaint on file.   HPI Diane Gamble is a 43 y.o. female.   Patient presents today for 3-day history of left wrist and thumb pain.  Reports that she was on her mother's porch when she bumped into something on her right side and fell onto her left outstretched hand.  Reports since that time, she has had severe pain in her wrist and pain with movement of the wrist.  The pain shoots up to her forearm at times.  No bruising or redness.  She thinks the wrist is swollen.  She has been taking Tylenol , ibuprofen, and meloxicam for symptoms without much improvement in pain.  Reports the pain is worse at nighttime.  Reports history of chronic gastritis and is aware that she should not be taking NSAIDs.    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   Dysmenorrhea 06/21/2021   Menorrhagia with regular cycle 06/21/2021   Fullness of abdomen 06/25/2019   LLQ abdominal pain 06/25/2019   PMS (premenstrual syndrome) 06/25/2019   Chronic pelvic pain in female 06/25/2019   Bilateral nephrolithiasis 06/24/2019   Vaginal discharge 06/24/2019    Past Surgical History:  Procedure Laterality Date   AUGMENTATION MAMMAPLASTY Bilateral 12/26/2020   CERVIX LESION DESTRUCTION     CYSTOSCOPY WITH HYDRODISTENSION AND BIOPSY  05/05/2007   INTRAUTERINE DEVICE (IUD) INSERTION  06/21/2021   KNEE ARTHROSCOPY WITH LATERAL RELEASE Left 08/02/2016   Procedure: LEFT KNEE ARTHROSCOPY WITH LATERAL RELEASE;  Surgeon: Evalene JONETTA Chancy, MD;  Location: [facility];  Service: Orthopedics;  Laterality: Left;   PLACEMENT OF BREAST IMPLANTS Bilateral    SYNOVECTOMY Left 08/02/2016   Procedure: SYNOVECTOMY OF  LEFT KNEE;  Surgeon: Evalene JONETTA Chancy, MD;  Location: [facility];  Service: Orthopedics;  Laterality: Left;    OB History     Gravida  3   Para      Term      Preterm      AB      Living  3      SAB      IAB      Ectopic      Multiple      Live Births  3        Obstetric Comments  Vag delivery x 3          Home Medications    Prior to Admission medications   Medication Sig Start Date End Date Taking? Authorizing Provider  traMADol  (ULTRAM ) 50 MG tablet Take 1 tablet (50 mg total) by mouth every 12 (twelve) hours as needed for up to 3 days. 06/11/24 06/14/24 Yes Chandra Harlene LABOR, NP  albuterol  (VENTOLIN  HFA) 108 (90 Base) MCG/ACT inhaler Inhale 1-2 puffs into the lungs every 6 (six) hours as needed for wheezing or shortness of breath. Patient not taking: Reported on 01/17/2023 06/18/21   Stuart Vernell Norris, PA-C  ALPRAZolam  (XANAX ) 0.5 MG tablet Take 0.5 mg by mouth.    [provider]  amphetamine -dextroamphetamine  (ADDERALL) 15 MG tablet Take 1 tablet by mouth 2 (two) times daily. 03/10/23   [provider]  ROSEALEE  PO Take by mouth as needed.    [provider]  dicyclomine  (BENTYL ) 20 MG tablet Take 1 tablet (20 mg total) by mouth 3 (three) times daily before meals for 5 days. 03/26/23 03/31/23  Angelena Smalls, MD  famotidine  (PEPCID ) 20 MG tablet Take 1 tablet (20 mg total) by mouth 2 (two) times daily for 5 days. 06/21/21 06/26/21  Izell Harari, MD  fluticasone  (FLONASE ) 50 MCG/ACT nasal spray Place 1 spray into both nostrils 2 (two) times daily. 08/27/21   Stuart Vernell Norris, PA-C  levonorgestrel  (MIRENA ) 20 MCG/DAY IUD 1 each by Intrauterine route once. 06/21/21   [provider]  pantoprazole  (PROTONIX ) 40 MG tablet Take 1 tablet (40 mg total) by mouth daily. 03/27/23 04/26/23  Jhonny Calvin NOVAK, MD  promethazine  (PHENERGAN ) 12.5 MG tablet Take 1 tablet (12.5 mg total) by mouth every 6 (six) hours  as needed for nausea or vomiting. 03/30/23   Sreenath, Sudheer B, MD  psyllium (REGULOID) 0.52 g capsule Take 0.52 g by mouth daily.    [provider]    Family History Family History  Problem Relation Age of Onset   Diabetes Maternal Grandmother    Breast cancer Neg Hx    Colon cancer Neg Hx    Esophageal cancer Neg Hx    Stomach cancer Neg Hx     Social History Social History   Tobacco Use   Smoking status: Some Days    Current packs/day: 0.50    Types: Cigarettes   Smokeless tobacco: Never  Vaping Use   Vaping status: Never Used  Substance Use Topics   Alcohol use: Yes    Comment: social   Drug use: No     Allergies   Doxycycline and Oxycodone    Review of Systems Review of Systems Per HPI  Physical Exam Triage Vital Signs ED Triage Vitals  Encounter Vitals Group     BP 06/11/24 1206 121/81     Girls Systolic BP Percentile --      Girls Diastolic BP Percentile --      Boys Systolic BP Percentile --      Boys Diastolic BP Percentile --      Pulse Rate 06/11/24 1206 100     Resp 06/11/24 1206 (!) 22     Temp 06/11/24 1206 98 F (36.7 C)     Temp Source 06/11/24 1206 Oral     SpO2 06/11/24 1206 97 %     Weight --      Height --      Head Circumference --      Peak Flow --      Pain Score 06/11/24 1203 10     Pain Loc --      Pain Education --      Exclude from Growth Chart --    No data found.  Updated Vital Signs BP 121/81 (BP Location: Right Arm)   Pulse 100   Temp 98 F (36.7 C) (Oral)   Resp (!) 22   LMP 05/27/2024 (Approximate)   SpO2 97%   Visual Acuity Right Eye Distance:   Left Eye Distance:   Bilateral Distance:    Right Eye Near:   Left Eye Near:    Bilateral Near:     Physical Exam Vitals and nursing note reviewed.  Constitutional:      General: She is not in acute distress.    Appearance: Normal appearance. She is not toxic-appearing.  HENT:     Mouth/Throat:  Mouth: Mucous membranes are moist.      Pharynx: Oropharynx is clear.  Pulmonary:     Effort: Pulmonary effort is normal. No respiratory distress.  Musculoskeletal:     Comments: Inspection: no swelling, bruising, obvious deformity or redness left wrist Palpation: tender to palpation left wrist diffusely; no obvious deformities palpated ROM: Difficult to assess secondary to pain; patient has pain with flexion or extension of the wrist Strength: Unable to assess secondary to pain Neurovascular: neurovascularly intact in distal bilateral upper extremities  Skin:    General: Skin is warm and dry.     Capillary Refill: Capillary refill takes less than 2 seconds.     Coloration: Skin is not jaundiced or pale.     Findings: No erythema.  Neurological:     Mental Status: She is alert and oriented to person, place, and time.  Psychiatric:        Behavior: Behavior is cooperative.      UC Treatments / Results  Labs (all labs ordered are listed, but only abnormal results are displayed) Labs Reviewed - No data to display  EKG   Radiology DG Wrist Complete Left Result Date: 06/11/2024 CLINICAL DATA:  Fall.  Pain radiating into the base of the thumb. EXAM: LEFT WRIST - COMPLETE 3+ VIEW COMPARISON:  None Available. FINDINGS: There is no evidence of fracture or dislocation. The carpal rows are intact and demonstrate normal alignment. The joint spaces are maintained. No significant soft tissue abnormalities are seen. IMPRESSION: No acute osseous abnormality. Electronically Signed   By: Harrietta Sherry M.D.   On: 06/11/2024 13:04    Procedures Procedures (including critical care time)  Medications Ordered in UC Medications - No data to display  Initial Impression / Assessment and Plan / UC Course  I have reviewed the triage vital signs and the nursing notes.  Pertinent labs & imaging results that were available during my care of the patient were reviewed by me and considered in my medical decision making (see chart for  details).   Vital signs are stable and patient is well-appearing.  She appears to be in pain.  X-ray imaging pending at time of discharge, upon my independent review I did not visualize any acute bony abnormalities.  Suspect wrist sprain.  Recommended rest, ice, elevation, compression.  Patient has thumb spica splint that she has been using that has seemed to help with the pain and I recommended she continue to wear it.  Recommended follow-up with Ortho if symptoms do not improve with treatment.  Initially, I prescribed tramadol  in addition to Tylenol  top with pain control, patient reports tramadol  makes her dizzy and declined further pain control medicine.  Return and ER precautions discussed.  The patient was given the opportunity to ask questions.  All questions answered to their satisfaction.  The patient is in agreement to this plan.   Final Clinical Impressions(s) / UC Diagnoses   Final diagnoses:  Left wrist pain     Discharge Instructions      I will contact you later today if the x-ray shows any broken bones.  In the meantime, continue to wear the splint that has been provided to you.  Continue Tylenol  500 to 1000 mg every 6 hours as needed.  I would recommend avoiding NSAIDs given your medical history.  You can take the tramadol  every 12 hours as needed for pain.  Follow-up with orthopedist if pain worsens or does not improve with treatment.    ED Prescriptions  Medication Sig Dispense Auth. Provider   traMADol  (ULTRAM ) 50 MG tablet Take 1 tablet (50 mg total) by mouth every 12 (twelve) hours as needed for up to 3 days. 6 tablet Chandra Harlene LABOR, NP      I have reviewed the PDMP during this encounter.   Chandra Harlene LABOR, NP 06/11/24 [phone]

## 2024-06-11 NOTE — Telephone Encounter (Signed)
 UC Provider reported to contact pt regarding wrist xray being negative for fracture.Provider's recommendations include continuing the splint pt was wearing at UC, ice, elevation, and Tylenol . If symptoms are not improved, pt to follow up with Ortho. Pt verbalized understanding.
